# Patient Record
Sex: Male | Born: 1980
Health system: Southern US, Community
[De-identification: ages and names within clinical notes are randomized; demographics above are authoritative.]

## PROBLEM LIST (undated history)

## (undated) DIAGNOSIS — I10 Essential (primary) hypertension: Secondary | ICD-10-CM

## (undated) DIAGNOSIS — B019 Varicella without complication: Secondary | ICD-10-CM

## (undated) DIAGNOSIS — R079 Chest pain, unspecified: Secondary | ICD-10-CM

## (undated) DIAGNOSIS — G4730 Sleep apnea, unspecified: Secondary | ICD-10-CM

## (undated) DIAGNOSIS — K219 Gastro-esophageal reflux disease without esophagitis: Secondary | ICD-10-CM

## (undated) DIAGNOSIS — F419 Anxiety disorder, unspecified: Secondary | ICD-10-CM

## (undated) HISTORY — DX: Varicella without complication: B01.9

## (undated) HISTORY — DX: Anxiety disorder, unspecified: F41.9

## (undated) HISTORY — PX: VASECTOMY: SHX75

## (undated) HISTORY — DX: Sleep apnea, unspecified: G47.30

## (undated) HISTORY — DX: Essential (primary) hypertension: I10

## (undated) HISTORY — DX: Gastro-esophageal reflux disease without esophagitis: K21.9

## (undated) HISTORY — PX: SPINE SURGERY: SHX786

## (undated) HISTORY — DX: Chest pain, unspecified: R07.9

## (undated) HISTORY — PX: CHOLECYSTECTOMY: SHX55

## (undated) HISTORY — PX: APPENDECTOMY: SHX54

---

## 2017-12-08 HISTORY — PX: OTHER SURGICAL HISTORY: SHX169

## 2019-07-27 ENCOUNTER — Other Ambulatory Visit: Payer: Self-pay

## 2019-07-27 ENCOUNTER — Ambulatory Visit (INDEPENDENT_AMBULATORY_CARE_PROVIDER_SITE_OTHER): Payer: Self-pay

## 2019-07-27 ENCOUNTER — Other Ambulatory Visit: Payer: Self-pay | Admitting: Gerontology

## 2019-07-27 DIAGNOSIS — Z0000 Encounter for general adult medical examination without abnormal findings: Secondary | ICD-10-CM

## 2019-12-09 DIAGNOSIS — L03116 Cellulitis of left lower limb: Secondary | ICD-10-CM | POA: Diagnosis not present

## 2019-12-09 DIAGNOSIS — T63301A Toxic effect of unspecified spider venom, accidental (unintentional), initial encounter: Secondary | ICD-10-CM | POA: Diagnosis not present

## 2019-12-09 DIAGNOSIS — Z6834 Body mass index (BMI) 34.0-34.9, adult: Secondary | ICD-10-CM | POA: Diagnosis not present

## 2019-12-13 DIAGNOSIS — S80862A Insect bite (nonvenomous), left lower leg, initial encounter: Secondary | ICD-10-CM | POA: Diagnosis not present

## 2019-12-13 DIAGNOSIS — Z6833 Body mass index (BMI) 33.0-33.9, adult: Secondary | ICD-10-CM | POA: Diagnosis not present

## 2019-12-13 DIAGNOSIS — W57XXXA Bitten or stung by nonvenomous insect and other nonvenomous arthropods, initial encounter: Secondary | ICD-10-CM | POA: Diagnosis not present

## 2019-12-13 DIAGNOSIS — L02416 Cutaneous abscess of left lower limb: Secondary | ICD-10-CM | POA: Diagnosis not present

## 2020-05-08 HISTORY — PX: CHOLECYSTECTOMY, LAPAROSCOPIC: SHX56

## 2020-05-20 ENCOUNTER — Emergency Department
Admission: EM | Admit: 2020-05-20 | Discharge: 2020-05-20 | Disposition: A | Discharge status: Home / Self Care | Payer: Self-pay | Attending: Emergency Medicine | Admitting: Emergency Medicine

## 2020-05-20 ENCOUNTER — Other Ambulatory Visit: Payer: Self-pay

## 2020-05-20 DIAGNOSIS — R112 Nausea with vomiting, unspecified: Secondary | ICD-10-CM | POA: Insufficient documentation

## 2020-05-20 DIAGNOSIS — Z5321 Procedure and treatment not carried out due to patient leaving prior to being seen by health care provider: Secondary | ICD-10-CM | POA: Insufficient documentation

## 2020-05-20 DIAGNOSIS — R1010 Upper abdominal pain, unspecified: Secondary | ICD-10-CM | POA: Insufficient documentation

## 2020-05-20 LAB — URINALYSIS, COMPLETE (UACMP) WITH MICROSCOPIC
Bilirubin Urine: NEGATIVE
Glucose, UA: NEGATIVE mg/dL
Hgb urine dipstick: NEGATIVE
Ketones, ur: NEGATIVE mg/dL
Leukocytes,Ua: NEGATIVE
Nitrite: NEGATIVE
Protein, ur: NEGATIVE mg/dL
Specific Gravity, Urine: 1.021 (ref 1.005–1.030)
Squamous Epithelial / HPF: NONE SEEN (ref 0–5)
pH: 7 (ref 5.0–8.0)

## 2020-05-20 LAB — COMPREHENSIVE METABOLIC PANEL
ALT: 20 U/L (ref 0–44)
AST: 18 U/L (ref 15–41)
Albumin: 4 g/dL (ref 3.5–5.0)
Alkaline Phosphatase: 50 U/L (ref 38–126)
Anion gap: 9 (ref 5–15)
BUN: 11 mg/dL (ref 6–20)
CO2: 26 mmol/L (ref 22–32)
Calcium: 8.7 mg/dL — ABNORMAL LOW (ref 8.9–10.3)
Chloride: 103 mmol/L (ref 98–111)
Creatinine, Ser: 1.02 mg/dL (ref 0.61–1.24)
GFR calc Af Amer: >60 mL/min (ref >60)
GFR calc non Af Amer: >60 mL/min (ref >60)
Glucose, Bld: 118 mg/dL — ABNORMAL HIGH (ref 70–99)
Potassium: 3.7 mmol/L (ref 3.5–5.1)
Sodium: 138 mmol/L (ref 135–145)
Total Bilirubin: 0.7 mg/dL (ref 0.3–1.2)
Total Protein: 6.9 g/dL (ref 6.5–8.1)

## 2020-05-20 LAB — CBC
HCT: 43.2 % (ref 39.0–52.0)
Hemoglobin: 15 g/dL (ref 13.0–17.0)
MCH: 30.4 pg (ref 26.0–34.0)
MCHC: 34.7 g/dL (ref 30.0–36.0)
MCV: 87.6 fL (ref 80.0–100.0)
Platelets: 326 10*3/uL (ref 150–400)
RBC: 4.93 MIL/uL (ref 4.22–5.81)
RDW: 12.6 % (ref 11.5–15.5)
WBC: 12 10*3/uL — ABNORMAL HIGH (ref 4.0–10.5)
nRBC: 0 % (ref 0.0–0.2)

## 2020-05-20 LAB — LIPASE, BLOOD: Lipase: 36 U/L (ref 11–51)

## 2020-05-20 NOTE — ED Notes (Signed)
Patient informed triage tech he was leaving. Patient encouraged to stay to be seen. Patient seen leaving lobby.

## 2020-05-20 NOTE — ED Triage Notes (Signed)
Patient reports upper abdominal pain for the past 5-6 nights.  Reports pain is worse at night.  Patient reports + nausea and vomiting.

## 2020-05-23 ENCOUNTER — Telehealth: Payer: Self-pay | Admitting: General Practice

## 2020-05-23 NOTE — Telephone Encounter (Signed)
Patient submitted new patient appt request  Left message to call back to schedule

## 2020-05-27 DIAGNOSIS — R1011 Right upper quadrant pain: Secondary | ICD-10-CM | POA: Diagnosis not present

## 2020-05-27 DIAGNOSIS — D72829 Elevated white blood cell count, unspecified: Secondary | ICD-10-CM | POA: Diagnosis not present

## 2020-05-27 DIAGNOSIS — R197 Diarrhea, unspecified: Secondary | ICD-10-CM | POA: Diagnosis not present

## 2020-05-27 DIAGNOSIS — K812 Acute cholecystitis with chronic cholecystitis: Secondary | ICD-10-CM | POA: Diagnosis not present

## 2020-05-27 DIAGNOSIS — Z6832 Body mass index (BMI) 32.0-32.9, adult: Secondary | ICD-10-CM | POA: Diagnosis not present

## 2020-05-27 DIAGNOSIS — I10 Essential (primary) hypertension: Secondary | ICD-10-CM | POA: Diagnosis not present

## 2020-05-27 DIAGNOSIS — K8012 Calculus of gallbladder with acute and chronic cholecystitis without obstruction: Secondary | ICD-10-CM | POA: Diagnosis not present

## 2020-05-27 DIAGNOSIS — E669 Obesity, unspecified: Secondary | ICD-10-CM | POA: Diagnosis not present

## 2020-05-27 DIAGNOSIS — Z20822 Contact with and (suspected) exposure to covid-19: Secondary | ICD-10-CM | POA: Diagnosis not present

## 2020-05-27 DIAGNOSIS — K8020 Calculus of gallbladder without cholecystitis without obstruction: Secondary | ICD-10-CM | POA: Diagnosis not present

## 2020-05-27 DIAGNOSIS — K810 Acute cholecystitis: Secondary | ICD-10-CM | POA: Diagnosis not present

## 2020-05-27 DIAGNOSIS — R10821 Right upper quadrant rebound abdominal tenderness: Secondary | ICD-10-CM | POA: Diagnosis not present

## 2020-05-27 DIAGNOSIS — F17200 Nicotine dependence, unspecified, uncomplicated: Secondary | ICD-10-CM | POA: Diagnosis not present

## 2020-05-27 DIAGNOSIS — K828 Other specified diseases of gallbladder: Secondary | ICD-10-CM | POA: Diagnosis not present

## 2020-08-10 ENCOUNTER — Ambulatory Visit: Payer: Self-pay | Admitting: Family Medicine

## 2020-08-22 ENCOUNTER — Other Ambulatory Visit: Payer: Self-pay

## 2020-08-22 ENCOUNTER — Encounter: Payer: Self-pay | Admitting: Family Medicine

## 2020-08-22 ENCOUNTER — Ambulatory Visit (INDEPENDENT_AMBULATORY_CARE_PROVIDER_SITE_OTHER): Payer: BC Managed Care – PPO | Admitting: Family Medicine

## 2020-08-22 VITALS — BP 142/86 | HR 101 | Temp 98.4°F | Ht 70.0 in | Wt 250.0 lb

## 2020-08-22 DIAGNOSIS — Z1159 Encounter for screening for other viral diseases: Secondary | ICD-10-CM

## 2020-08-22 DIAGNOSIS — R635 Abnormal weight gain: Secondary | ICD-10-CM

## 2020-08-22 DIAGNOSIS — Z6835 Body mass index (BMI) 35.0-35.9, adult: Secondary | ICD-10-CM | POA: Diagnosis not present

## 2020-08-22 DIAGNOSIS — R350 Frequency of micturition: Secondary | ICD-10-CM | POA: Diagnosis not present

## 2020-08-22 DIAGNOSIS — Z114 Encounter for screening for human immunodeficiency virus [HIV]: Secondary | ICD-10-CM

## 2020-08-22 DIAGNOSIS — E6609 Other obesity due to excess calories: Secondary | ICD-10-CM

## 2020-08-22 DIAGNOSIS — Z7689 Persons encountering health services in other specified circumstances: Secondary | ICD-10-CM | POA: Diagnosis not present

## 2020-08-22 DIAGNOSIS — K219 Gastro-esophageal reflux disease without esophagitis: Secondary | ICD-10-CM

## 2020-08-22 DIAGNOSIS — R5383 Other fatigue: Secondary | ICD-10-CM | POA: Diagnosis not present

## 2020-08-22 DIAGNOSIS — Z720 Tobacco use: Secondary | ICD-10-CM

## 2020-08-22 LAB — POC URINALSYSI DIPSTICK (AUTOMATED)
Bilirubin, UA: NEGATIVE
Blood, UA: NEGATIVE
Glucose, UA: NEGATIVE
Ketones, UA: NEGATIVE
Leukocytes, UA: NEGATIVE
Nitrite, UA: NEGATIVE
Protein, UA: POSITIVE — AB
Spec Grav, UA: >=1.03 — AB (ref 1.010–1.025)
Urobilinogen, UA: 0.2 E.U./dL
pH, UA: 5.5 (ref 5.0–8.0)

## 2020-08-22 NOTE — Patient Instructions (Signed)
Take over the counter Pepcid (generic is fine) every night with dinner for 2 weeks, can take longer  Consider stopping smoking  Follow up in 3 months for your complete physical  There is not one right eating plan for everyone.  It may take trial and error to find what will work for you.  It is important to get adequate protein and fiber with your meals.  It is okay to not eat breakfast or to skip meals if you are not hungry.  Avoid snacking between meals.  Unless you are on a fluid restriction, drink 80 to 90 ounces of water a day.  Suggested resources- www.dietdoctor.com/diabetes/diet www.adaptyourlifeacademy.com-there is a quiz to help you determine how many carbohydrates you should eat a day  www.thefastingmethod.com  Here are some guidelines to help you with meal planning -  Avoid all processed and packaged foods (bread, pasta, crackers, chips, etc) and beverages containing calories.  Avoid added sugars and excessive natural sugars.  Pay attention to how you feel if you consume artificial sweeteners.  Do they make you more hungry or raise your blood sugar?  With every meal and snack, aim to get 20 g of protein (3 ounces of meat, 4 ounces of fish, 3 eggs, protein powder, 1 cup [place] yogurt, 1 cup cottage cheese, etc.)  Increase fiber in the form of non-starchy vegetables.  These help you feel full with very little carbohydrates and are good for gut health.  Nonstarchy vegetables include summer squash, onions, peppers, tomatoes, eggplant, broccoli, cauliflower, cabbage, lettuce, spinach.  Have small amounts of good fats such as avocado, nuts, olive oil, nut butters, olives.  Add a little cheese to your meals to make them tasty.   Try to plan your meals for the week and do some meal preparation when able.  If possible, make lunches for the week ahead of time.  Plan a couple of dinners and make enough so you can have leftovers.  Build in a treat once a week.    Gastroesophageal Reflux  Disease, Adult Gastroesophageal reflux (GER) happens when acid from the stomach flows up into the tube that connects the mouth and the stomach (esophagus). Normally, food travels down the esophagus and stays in the stomach to be digested. However, when a person has GER, food and stomach acid sometimes move back up into the esophagus. If this becomes a more serious problem, the person may be diagnosed with a disease called gastroesophageal reflux disease (GERD). GERD occurs when the reflux:  Happens often.  Causes frequent or severe symptoms.  Causes problems such as damage to the esophagus. When stomach acid comes in contact with the esophagus, the acid may cause soreness (inflammation) in the esophagus. Over time, GERD may create small holes (ulcers) in the lining of the esophagus. What are the causes? This condition is caused by a problem with the muscle between the esophagus and the stomach (lower esophageal sphincter, or LES). Normally, the LES muscle closes after food passes through the esophagus to the stomach. When the LES is weakened or abnormal, it does not close properly, and that allows food and stomach acid to go back up into the esophagus. The LES can be weakened by certain dietary substances, medicines, and medical conditions, including:  Tobacco use.  Pregnancy.  Having a hiatal hernia.  Alcohol use.  Certain foods and beverages, such as coffee, chocolate, onions, and peppermint. What increases the risk? You are more likely to develop this condition if you:  Have an increased  body weight.  Have a connective tissue disorder.  Use NSAID medicines. What are the signs or symptoms? Symptoms of this condition include:  Heartburn.  Difficult or painful swallowing.  The feeling of having a lump in the throat.  Abitter taste in the mouth.  Bad breath.  Having a large amount of saliva.  Having an upset or bloated stomach.  Belching.  Chest pain. Different  conditions can cause chest pain. Make sure you see your health care provider if you experience chest pain.  Shortness of breath or wheezing.  Ongoing (chronic) cough or a night-time cough.  Wearing away of tooth enamel.  Weight loss. How is this diagnosed? Your health care provider will take a medical history and perform a physical exam. To determine if you have mild or severe GERD, your health care provider may also monitor how you respond to treatment. You may also have tests, including:  A test to examine your stomach and esophagus with a small camera (endoscopy).  A test thatmeasures the acidity level in your esophagus.  A test thatmeasures how much pressure is on your esophagus.  A barium swallow or modified barium swallow test to show the shape, size, and functioning of your esophagus. How is this treated? The goal of treatment is to help relieve your symptoms and to prevent complications. Treatment for this condition may vary depending on how severe your symptoms are. Your health care provider may recommend:  Changes to your diet.  Medicine.  Surgery. Follow these instructions at home: Eating and drinking   Follow a diet as recommended by your health care provider. This may involve avoiding foods and drinks such as: ? Coffee and tea (with or without caffeine). ? Drinks that containalcohol. ? Energy drinks and sports drinks. ? Carbonated drinks or sodas. ? Chocolate and cocoa. ? Peppermint and mint flavorings. ? Garlic and onions. ? Horseradish. ? Spicy and acidic foods, including peppers, chili powder, curry powder, vinegar, hot sauces, and barbecue sauce. ? Citrus fruit juices and citrus fruits, such as oranges, lemons, and limes. ? Tomato-based foods, such as red sauce, chili, salsa, and pizza with red sauce. ? Fried and fatty foods, such as donuts, french fries, potato chips, and high-fat dressings. ? High-fat meats, such as hot dogs and fatty cuts of red and  white meats, such as rib eye steak, sausage, ham, and bacon. ? High-fat dairy items, such as whole milk, butter, and cream cheese.  Eat small, frequent meals instead of large meals.  Avoid drinking large amounts of liquid with your meals.  Avoid eating meals during the 2-3 hours before bedtime.  Avoid lying down right after you eat.  Do not exercise right after you eat. Lifestyle   Do not use any products that contain nicotine or tobacco, such as cigarettes, e-cigarettes, and chewing tobacco. If you need help quitting, ask your health care provider.  Try to reduce your stress by using methods such as yoga or meditation. If you need help reducing stress, ask your health care provider.  If you are overweight, reduce your weight to an amount that is healthy for you. Ask your health care provider for guidance about a safe weight loss goal. General instructions  Pay attention to any changes in your symptoms.  Take over-the-counter and prescription medicines only as told by your health care provider. Do not take aspirin, ibuprofen, or other NSAIDs unless your health care provider told you to do so.  Wear loose-fitting clothing. Do not wear anything tight  around your waist that causes pressure on your abdomen.  Raise (elevate) the head of your bed about 6 inches (15 cm).  Avoid bending over if this makes your symptoms worse.  Keep all follow-up visits as told by your health care provider. This is important. Contact a health care provider if:  You have: ? New symptoms. ? Unexplained weight loss. ? Difficulty swallowing or it hurts to swallow. ? Wheezing or a persistent cough. ? A hoarse voice.  Your symptoms do not improve with treatment. Get help right away if you:  Have pain in your arms, neck, jaw, teeth, or back.  Feel sweaty, dizzy, or light-headed.  Have chest pain or shortness of breath.  Vomit and your vomit looks like blood or coffee grounds.  Faint.  Have  stool that is bloody or black.  Cannot swallow, drink, or eat. Summary  Gastroesophageal reflux happens when acid from the stomach flows up into the esophagus. GERD is a disease in which the reflux happens often, causes frequent or severe symptoms, or causes problems such as damage to the esophagus.  Treatment for this condition may vary depending on how severe your symptoms are. Your health care provider may recommend diet and lifestyle changes, medicine, or surgery.  Contact a health care provider if you have new or worsening symptoms.  Take over-the-counter and prescription medicines only as told by your health care provider. Do not take aspirin, ibuprofen, or other NSAIDs unless your health care provider told you to do so.  Keep all follow-up visits as told by your health care provider. This is important. This information is not intended to replace advice given to you by your health care provider. Make sure you discuss any questions you have with your health care provider. Document Revised: 06/02/2018 Document Reviewed: 06/02/2018 Elsevier Patient Education  2020 [employer].

## 2020-08-22 NOTE — Progress Notes (Signed)
Subjective:    Patient ID: Jerry Banks, male    DOB: 1981/03/11, 39 y.o.   MRN: [id number]  HPI Chief Complaint  Patient presents with   New Patient (Initial Visit)    previous PCP Dr Evert Kohl   Urinary Frequency    x2 weeks denies pain   Hypertension   This is a 39 yo male who presents today to establish care. Accompanied by his wife.   Last CPE- 07/2019- work  Tdap- with in last couple of years following an accident Flu- never gets Covid- not vaccinated, not planning to get vaccinated Dental- regular Eye- unknown Exercise- none  Smoking- 1 ppd, has tried Chantix- bad side effects, quit cold [place], lasted for 9 months, increased stress dealing with his family and he resumed.  Wife also smokes.  Increased urinary frequency x several weeks, increased hunger since gallbladder removed.  Eating large amounts of carbohydrates, sweets, breads.  Prior to gallbladder surgery did not eat large quantities.  Has increased acid indigestion at bedtime.  Has been taking Tums.  Marijuana- nightly for sleep, has been smoking for many years.  Review of Systems Occasional headache, no visual change, no chest pain, shortness of breath, diarrhea, constipation, dysuria, hematuria, leg swelling    Objective:   Physical Exam Physical Exam  Constitutional: Oriented to person, place, and time. Appears well-developed and well-nourished.  HENT:  Head: Normocephalic and atraumatic.  Eyes: Conjunctivae are normal.  Neck: Normal range of motion. Neck supple.  Cardiovascular: Normal rate, regular rhythm and normal heart sounds.   Pulmonary/Chest: Effort normal and breath sounds normal.  Musculoskeletal: No lower extremity edema.   Neurological: Alert and oriented to person, place, and time.  Skin: Skin is warm and dry.  Psychiatric: Normal mood and affect. Behavior is normal. Judgment and thought content normal.  Vitals reviewed.     BP (!) 142/86    Pulse (!) 101    Temp 98.4 F (36.9  C) (Temporal)    Ht 5\' 10"  (1.778 m)    Wt 250 lb (113.4 kg)    SpO2 96%    BMI 35.87 kg/m  Wt Readings from Last 3 Encounters:  08/22/20 250 lb (113.4 kg)  05/20/20 220 lb (99.8 kg)   Depression screen PHQ 2/9 08/22/2020  Decreased Interest 0  Down, Depressed, Hopeless 0  PHQ - 2 Score 0       Assessment & Plan:  1. Encounter to establish care - reviewed available records, health maintenance recommended- flu, Covid vaccine  2. Weight gain - TSH  3. Fatigue, unspecified type - TSH - CBC with Differential - Comprehensive metabolic panel  4. Screening for HIV without presence of risk factors - HIV Antibody (routine testing w rflx)  5. Encounter for hepatitis C screening test for low risk patient - Hepatitis C antibody  6. Class 2 obesity due to excess calories without serious comorbidity with body mass index (BMI) of 35.0 to 35.9 in adult -Provided written and verbal information regarding recommended dietary changes - Lipid Panel  7. Urinary frequency - POCT Urinalysis Dipstick (Automated)  8. Tobacco abuse -Provided written information and encouraged him to consider smoking cessation  9.  Gastroesophageal reflux disease, esophagitis presence unspecified -Instructed him to start taking famotidine daily with dinner, try for 2 weeks, can continue with improvement -Discussed importance of weight loss, provided written and verbal information regarding triggers  This visit occurred during the SARS-CoV-2 public health emergency.  Safety protocols were in place, including screening questions  prior to the visit, additional usage of staff PPE, and extensive cleaning of exam room while observing appropriate contact time as indicated for disinfecting solutions.      Olean Ree, FNP-BC  [facility], [facility]  08/22/2020 5:10 PM

## 2020-08-23 ENCOUNTER — Other Ambulatory Visit (INDEPENDENT_AMBULATORY_CARE_PROVIDER_SITE_OTHER): Payer: BC Managed Care – PPO

## 2020-08-23 DIAGNOSIS — Z131 Encounter for screening for diabetes mellitus: Secondary | ICD-10-CM

## 2020-08-23 LAB — LIPID PANEL
Cholesterol: 120 mg/dL (ref 0–200)
HDL: 35.4 mg/dL — ABNORMAL LOW (ref >39.00)
LDL Cholesterol: 65 mg/dL (ref 0–99)
NonHDL: 84.67
Total CHOL/HDL Ratio: 3
Triglycerides: 99 mg/dL (ref 0.0–149.0)
VLDL: 19.8 mg/dL (ref 0.0–40.0)

## 2020-08-23 LAB — COMPREHENSIVE METABOLIC PANEL
ALT: 19 U/L (ref 0–53)
AST: 18 U/L (ref 0–37)
Albumin: 4.6 g/dL (ref 3.5–5.2)
Alkaline Phosphatase: 49 U/L (ref 39–117)
BUN: 10 mg/dL (ref 6–23)
CO2: 27 mEq/L (ref 19–32)
Calcium: 9.5 mg/dL (ref 8.4–10.5)
Chloride: 104 mEq/L (ref 96–112)
Creatinine, Ser: 1.05 mg/dL (ref 0.40–1.50)
GFR: 79.03 mL/min (ref >60.00)
Glucose, Bld: 94 mg/dL (ref 70–99)
Potassium: 4.1 mEq/L (ref 3.5–5.1)
Sodium: 139 mEq/L (ref 135–145)
Total Bilirubin: 0.5 mg/dL (ref 0.2–1.2)
Total Protein: 7.2 g/dL (ref 6.0–8.3)

## 2020-08-23 LAB — HIV ANTIBODY (ROUTINE TESTING W REFLEX): HIV 1&2 Ab, 4th Generation: NONREACTIVE

## 2020-08-23 LAB — CBC WITH DIFFERENTIAL/PLATELET
Basophils Absolute: 0.1 10*3/uL (ref 0.0–0.1)
Basophils Relative: 1.1 % (ref 0.0–3.0)
Eosinophils Absolute: 0.3 10*3/uL (ref 0.0–0.7)
Eosinophils Relative: 2.3 % (ref 0.0–5.0)
HCT: 46.8 % (ref 39.0–52.0)
Hemoglobin: 15.9 g/dL (ref 13.0–17.0)
Lymphocytes Relative: 40.6 % (ref 12.0–46.0)
Lymphs Abs: 4.8 10*3/uL — ABNORMAL HIGH (ref 0.7–4.0)
MCHC: 34 g/dL (ref 30.0–36.0)
MCV: 90.2 fl (ref 78.0–100.0)
Monocytes Absolute: 0.7 10*3/uL (ref 0.1–1.0)
Monocytes Relative: 6.2 % (ref 3.0–12.0)
Neutro Abs: 5.9 10*3/uL (ref 1.4–7.7)
Neutrophils Relative %: 49.8 % (ref 43.0–77.0)
Platelets: 304 10*3/uL (ref 150.0–400.0)
RBC: 5.19 Mil/uL (ref 4.22–5.81)
RDW: 13.3 % (ref 11.5–15.5)
WBC: 11.7 10*3/uL — ABNORMAL HIGH (ref 4.0–10.5)

## 2020-08-23 LAB — HEPATITIS C ANTIBODY
Hepatitis C Ab: NONREACTIVE
SIGNAL TO CUT-OFF: 0.02 (ref <1.00)

## 2020-08-23 LAB — TSH: TSH: 1.62 u[IU]/mL (ref 0.35–4.50)

## 2020-08-24 LAB — HEMOGLOBIN A1C: Hgb A1c MFr Bld: 5.8 % (ref 4.6–6.5)

## 2020-09-16 DIAGNOSIS — Z20822 Contact with and (suspected) exposure to covid-19: Secondary | ICD-10-CM | POA: Diagnosis not present

## 2020-11-21 ENCOUNTER — Ambulatory Visit: Payer: BC Managed Care – PPO | Admitting: Family Medicine

## 2021-04-05 DIAGNOSIS — Z20822 Contact with and (suspected) exposure to covid-19: Secondary | ICD-10-CM | POA: Diagnosis not present

## 2021-05-09 ENCOUNTER — Other Ambulatory Visit: Payer: Self-pay

## 2021-05-09 ENCOUNTER — Ambulatory Visit: Payer: BC Managed Care – PPO | Admitting: Podiatry

## 2021-05-09 ENCOUNTER — Encounter: Payer: Self-pay | Admitting: Podiatry

## 2021-05-09 DIAGNOSIS — L600 Ingrowing nail: Secondary | ICD-10-CM | POA: Diagnosis not present

## 2021-05-09 NOTE — Patient Instructions (Signed)
Soak Instructions    THE DAY AFTER THE PROCEDURE  Place 1/4 cup of epsom salts or betadinein a quart of warm tap water.  Submerge your foot or feet with outer bandage intact for the initial soak; this will allow the bandage to become moist and wet for easy lift off.  Once you remove your bandage, continue to soak in the solution for 20 minutes.  This soak should be done twice a day.  Next, remove your foot or feet from solution, blot dry the affected area and cover.  You may use a band aid large enough to cover the area or use gauze and tape.  Apply other medications to the area as directed by the doctor such as polysporin or neosporin with pain relief.  IF YOUR SKIN BECOMES IRRITATED WHILE USING THESE INSTRUCTIONS, IT IS OKAY TO SWITCH TO  WHITE VINEGAR AND WATER. Or you may use antibacterial soap and water to keep the toe clean  Monitor for any signs/symptoms of infection. Call the office immediately if any occur or go directly to the emergency room. Call with any questions/concerns.    Long Term Care Instructions-Post Nail Surgery  You have had your ingrown toenail and root treated with a chemical.  This chemical causes a burn that will drain and ooze like a blister.  This can drain for 6-8 weeks or longer.  It is important to keep this area clean, covered, and follow the soaking instructions dispensed at the time of your surgery.  This area will eventually dry and form a scab.  Once the scab forms you no longer need to soak or apply a dressing.  If at any time you experience an increase in pain, redness, swelling, or drainage, you should contact the office as soon as possible. 

## 2021-05-09 NOTE — Progress Notes (Signed)
  Subjective:  Patient ID: Jerry Banks, male    DOB: 1981/09/14,  MRN: [id number]  Chief Complaint  Patient presents with  . Nail Problem    Patient presents today for ingrown toenail right hallux medial border x years off and on.    40 y.o. male presents with the above complaint.  Patient presents with complaint of right hallux medial border ingrown.  Patient states been going on for years on and off he is done self debridement.  He has not seen anyone else prior to seeing me.  He would like to have it removed.  Is just not giving him the relief when doing it himself.  It is not clinically infected.  He denies any other acute complaints.  Pain scale 7 out of 10.   Review of Systems: Negative except as noted in the HPI. Denies N/V/F/Ch.  Past Medical History:  Diagnosis Date  . Chicken pox   . High blood pressure     Current Outpatient Medications:  .  ibuprofen (ADVIL) 600 MG tablet, Take by mouth., Disp: , Rfl:   Social History   Tobacco Use  Smoking Status Current Every Day Smoker  . Packs/day: 1.00  . Types: Cigarettes  Smokeless Tobacco Never Used    No Known Allergies Objective:  There were no vitals filed for this visit. There is no height or weight on file to calculate BMI. Constitutional Well developed. Well nourished.  Vascular Dorsalis pedis pulses palpable bilaterally. Posterior tibial pulses palpable bilaterally. Capillary refill normal to all digits.  No cyanosis or clubbing noted. Pedal hair growth normal.  Neurologic Normal speech. Oriented to person, place, and time. Epicritic sensation to light touch grossly present bilaterally.  Dermatologic Painful ingrowing nail at medial nail borders of the hallux nail right. No other open wounds. No skin lesions.  Orthopedic: Normal joint ROM without pain or crepitus bilaterally. No visible deformities. No bony tenderness.   Radiographs: None Assessment:   1. Ingrown toenail of right foot    Plan:   Patient was evaluated and treated and all questions answered.  Ingrown Nail, right -Patient elects to proceed with minor surgery to remove ingrown toenail removal today. Consent reviewed and signed by patient. -Ingrown nail excised. See procedure note. -Educated on post-procedure care including soaking. Written instructions provided and reviewed. -Patient to follow up in 2 weeks for nail check.  Procedure: Excision of Ingrown Toenail Location: Right 1st toe medial nail borders. Anesthesia: Lidocaine 1% plain; 1.5 mL and Marcaine 0.5% plain; 1.5 mL, digital block. Skin Prep: Betadine. Dressing: Silvadene; telfa; dry, sterile, compression dressing. Technique: Following skin prep, the toe was exsanguinated and a tourniquet was secured at the base of the toe. The affected nail border was freed, split with a nail splitter, and excised. Chemical matrixectomy was then performed with phenol and irrigated out with alcohol. The tourniquet was then removed and sterile dressing applied. Disposition: Patient tolerated procedure well. Patient to return in 2 weeks for follow-up.   No follow-ups on file.

## 2021-05-29 IMAGING — DX CHEST  1 VIEW
1 series · 1 of 1 positions shown · non-contrast
Comparison: None.

CLINICAL DATA: 37-year-old male presenting for annual physical.

EXAM:
CHEST  1 VIEW

[chest pa]
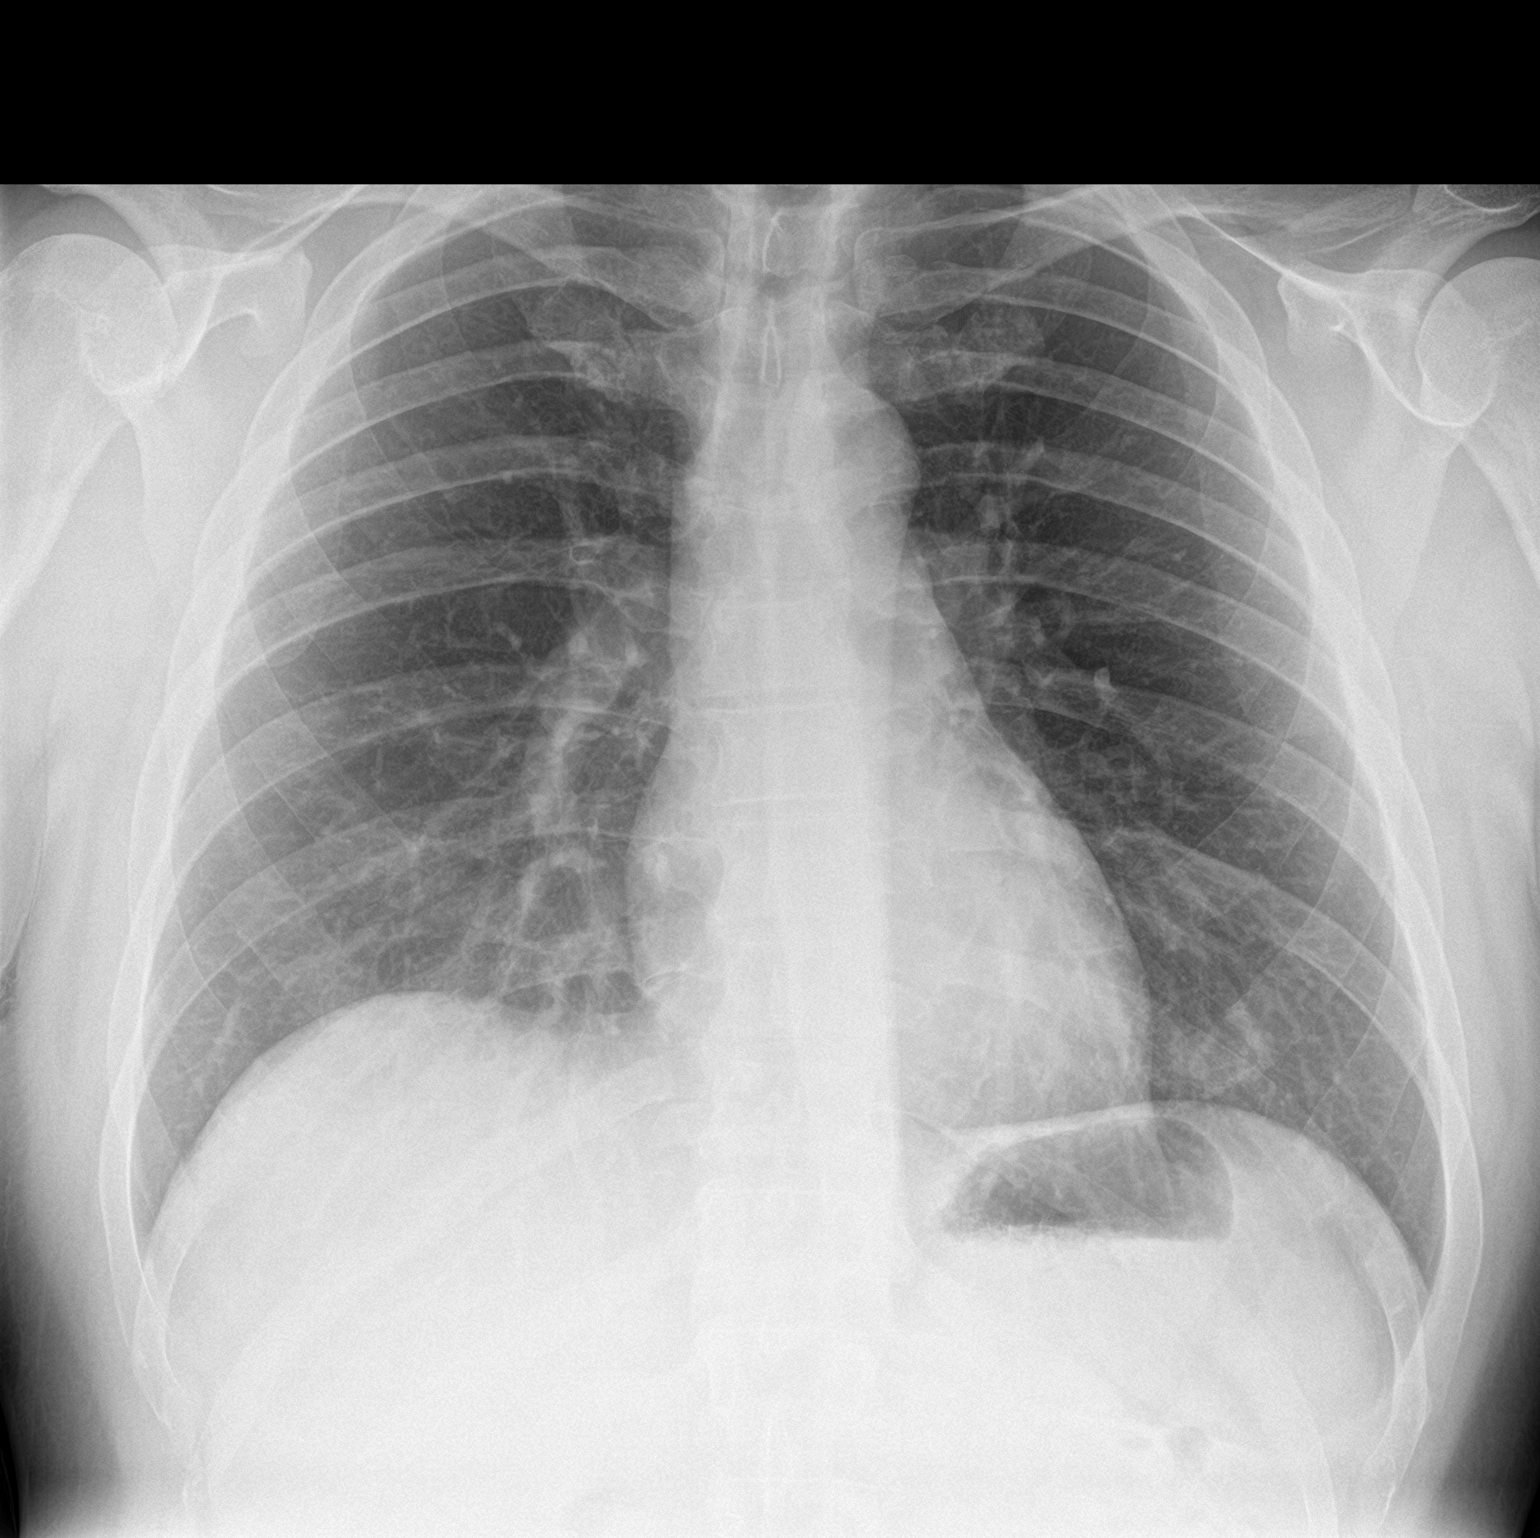

[1 of 1 positions shown; findings below may reference images not displayed]

FINDINGS: The heart size and mediastinal contours are within normal limits.
Both lungs are clear. The visualized skeletal structures are
unremarkable.
IMPRESSION: No active disease.

## 2021-07-04 DIAGNOSIS — Z20822 Contact with and (suspected) exposure to covid-19: Secondary | ICD-10-CM | POA: Diagnosis not present

## 2021-07-29 DIAGNOSIS — R0602 Shortness of breath: Secondary | ICD-10-CM | POA: Diagnosis not present

## 2021-07-29 DIAGNOSIS — R079 Chest pain, unspecified: Secondary | ICD-10-CM | POA: Diagnosis not present

## 2021-07-29 DIAGNOSIS — Z5321 Procedure and treatment not carried out due to patient leaving prior to being seen by health care provider: Secondary | ICD-10-CM | POA: Diagnosis not present

## 2021-12-18 DIAGNOSIS — M542 Cervicalgia: Secondary | ICD-10-CM | POA: Diagnosis not present

## 2021-12-18 DIAGNOSIS — M9901 Segmental and somatic dysfunction of cervical region: Secondary | ICD-10-CM | POA: Diagnosis not present

## 2021-12-18 DIAGNOSIS — M62830 Muscle spasm of back: Secondary | ICD-10-CM | POA: Diagnosis not present

## 2021-12-18 DIAGNOSIS — M4602 Spinal enthesopathy, cervical region: Secondary | ICD-10-CM | POA: Diagnosis not present

## 2022-01-28 DIAGNOSIS — M9901 Segmental and somatic dysfunction of cervical region: Secondary | ICD-10-CM | POA: Diagnosis not present

## 2022-01-28 DIAGNOSIS — M4602 Spinal enthesopathy, cervical region: Secondary | ICD-10-CM | POA: Diagnosis not present

## 2022-01-28 DIAGNOSIS — M542 Cervicalgia: Secondary | ICD-10-CM | POA: Diagnosis not present

## 2022-01-28 DIAGNOSIS — M62830 Muscle spasm of back: Secondary | ICD-10-CM | POA: Diagnosis not present

## 2022-01-30 DIAGNOSIS — M4602 Spinal enthesopathy, cervical region: Secondary | ICD-10-CM | POA: Diagnosis not present

## 2022-01-30 DIAGNOSIS — M542 Cervicalgia: Secondary | ICD-10-CM | POA: Diagnosis not present

## 2022-01-30 DIAGNOSIS — M62830 Muscle spasm of back: Secondary | ICD-10-CM | POA: Diagnosis not present

## 2022-01-30 DIAGNOSIS — M9901 Segmental and somatic dysfunction of cervical region: Secondary | ICD-10-CM | POA: Diagnosis not present

## 2022-02-11 DIAGNOSIS — M62830 Muscle spasm of back: Secondary | ICD-10-CM | POA: Diagnosis not present

## 2022-02-11 DIAGNOSIS — M9901 Segmental and somatic dysfunction of cervical region: Secondary | ICD-10-CM | POA: Diagnosis not present

## 2022-02-11 DIAGNOSIS — M542 Cervicalgia: Secondary | ICD-10-CM | POA: Diagnosis not present

## 2022-02-11 DIAGNOSIS — M4602 Spinal enthesopathy, cervical region: Secondary | ICD-10-CM | POA: Diagnosis not present

## 2022-02-21 ENCOUNTER — Telehealth: Payer: Self-pay

## 2022-02-21 ENCOUNTER — Ambulatory Visit: Payer: BC Managed Care – PPO | Admitting: Family

## 2022-02-21 NOTE — Telephone Encounter (Signed)
Pts wife said that recently pt has H/A on and off and BP 163/100-105.pts wife thinks that pt has pinched nerve issue in neck.now no H/a,CP,SOB, or dizziness. Also pt had GB removed 2020 but on and off having mid abd pain in upper abd and waistline like when had GB attacks. Last time pt had abd pain was 02/20/22.pts wife had made my chart appt for pt and wants to make sure pt does not have problem when comes for appt. I spoke with Red Christians FNP who appt is scheduled with today and she said she will see pt and address most concerning issue today and then pt can do TOC appt.after eval T. Dugal FNP will advise her recommendation for care which could include pt going to ED if necessary. Pts wife voiced understanding and pt will keep appt today. UC & ED precautions given and pts wife voiced understanding. Pt will come approx 30 mins early to get checked in at Tabitha's request. Sending note to T Dugal FNP. ?

## 2022-03-10 ENCOUNTER — Ambulatory Visit: Payer: BC Managed Care – PPO | Admitting: Adult Health

## 2022-04-02 DIAGNOSIS — I10 Essential (primary) hypertension: Secondary | ICD-10-CM | POA: Diagnosis not present

## 2022-04-02 DIAGNOSIS — M542 Cervicalgia: Secondary | ICD-10-CM | POA: Diagnosis not present

## 2022-04-02 DIAGNOSIS — M5412 Radiculopathy, cervical region: Secondary | ICD-10-CM | POA: Diagnosis not present

## 2022-04-02 DIAGNOSIS — F17200 Nicotine dependence, unspecified, uncomplicated: Secondary | ICD-10-CM | POA: Diagnosis not present

## 2022-04-27 DIAGNOSIS — M542 Cervicalgia: Secondary | ICD-10-CM | POA: Diagnosis not present

## 2022-04-27 DIAGNOSIS — R7303 Prediabetes: Secondary | ICD-10-CM | POA: Diagnosis not present

## 2022-04-27 DIAGNOSIS — Z716 Tobacco abuse counseling: Secondary | ICD-10-CM | POA: Diagnosis not present

## 2022-04-27 DIAGNOSIS — I10 Essential (primary) hypertension: Secondary | ICD-10-CM | POA: Diagnosis not present

## 2022-06-08 DIAGNOSIS — M542 Cervicalgia: Secondary | ICD-10-CM | POA: Diagnosis not present

## 2022-06-08 DIAGNOSIS — I10 Essential (primary) hypertension: Secondary | ICD-10-CM | POA: Diagnosis not present

## 2022-08-21 ENCOUNTER — Ambulatory Visit: Payer: BC Managed Care – PPO | Admitting: Podiatry

## 2022-08-21 DIAGNOSIS — L600 Ingrowing nail: Secondary | ICD-10-CM | POA: Diagnosis not present

## 2022-08-28 NOTE — Progress Notes (Signed)
  Subjective:  Patient ID: Jerry Banks, male    DOB: [birth year],  MRN: [id number]  Chief Complaint  Patient presents with   Nail Problem    41 y.o. male presents with the above complaint.  Patient presents with right hallux medial border ingrown.  Patient said pain for touch is progressive gotten worse worse with ambulation hurts with pressure.  He would like to have it removed.  He states that it has grown back again and is causing some pain.  He denies any other acute issues.  7 out of 10 pain scale   Review of Systems: Negative except as noted in the HPI. Denies N/V/F/Ch.  Past Medical History:  Diagnosis Date   Chicken pox    High blood pressure     Current Outpatient Medications:    ibuprofen (ADVIL) 600 MG tablet, Take by mouth., Disp: , Rfl:   Social History   Tobacco Use  Smoking Status Every Day   Packs/day: 1.00   Types: Cigarettes  Smokeless Tobacco Never    No Known Allergies Objective:  There were no vitals filed for this visit. There is no height or weight on file to calculate BMI. Constitutional Well developed. Well nourished.  Vascular Dorsalis pedis pulses palpable bilaterally. Posterior tibial pulses palpable bilaterally. Capillary refill normal to all digits.  No cyanosis or clubbing noted. Pedal hair growth normal.  Neurologic Normal speech. Oriented to person, place, and time. Epicritic sensation to light touch grossly present bilaterally.  Dermatologic Painful ingrowing nail at medial nail borders of the hallux nail right No other open wounds. No skin lesions.  Orthopedic: Normal joint ROM without pain or crepitus bilaterally. No visible deformities. No bony tenderness.   Radiographs: None Assessment:   1. Ingrown toenail of right foot    Plan:  Patient was evaluated and treated and all questions answered.  Ingrown Nail, right -Patient elects to proceed with minor surgery to remove ingrown toenail removal today. Consent reviewed and  signed by patient. -Ingrown nail excised. See procedure note. -Educated on post-procedure care including soaking. Written instructions provided and reviewed. -Patient to follow up in 2 weeks for nail check.  Procedure: Excision of Ingrown Toenail Location: Right 1st toe medial nail borders. Anesthesia: Lidocaine 1% plain; 1.5 mL and Marcaine 0.5% plain; 1.5 mL, digital block. Skin Prep: Betadine. Dressing: Silvadene; telfa; dry, sterile, compression dressing. Technique: Following skin prep, the toe was exsanguinated and a tourniquet was secured at the base of the toe. The affected nail border was freed, split with a nail splitter, and excised. Chemical matrixectomy was then performed with phenol and irrigated out with alcohol. The tourniquet was then removed and sterile dressing applied. Disposition: Patient tolerated procedure well. Patient to return in 2 weeks for follow-up.   No follow-ups on file.

## 2022-10-09 DIAGNOSIS — R509 Fever, unspecified: Secondary | ICD-10-CM | POA: Diagnosis not present

## 2022-10-09 DIAGNOSIS — Z20822 Contact with and (suspected) exposure to covid-19: Secondary | ICD-10-CM | POA: Diagnosis not present

## 2022-10-09 DIAGNOSIS — Z03818 Encounter for observation for suspected exposure to other biological agents ruled out: Secondary | ICD-10-CM | POA: Diagnosis not present

## 2022-10-09 DIAGNOSIS — J069 Acute upper respiratory infection, unspecified: Secondary | ICD-10-CM | POA: Diagnosis not present

## 2023-04-25 DIAGNOSIS — I10 Essential (primary) hypertension: Secondary | ICD-10-CM | POA: Diagnosis not present

## 2023-04-25 DIAGNOSIS — F17200 Nicotine dependence, unspecified, uncomplicated: Secondary | ICD-10-CM | POA: Diagnosis not present

## 2024-01-14 ENCOUNTER — Other Ambulatory Visit: Payer: Self-pay

## 2024-01-14 ENCOUNTER — Emergency Department (HOSPITAL_BASED_OUTPATIENT_CLINIC_OR_DEPARTMENT_OTHER): Payer: BC Managed Care – PPO | Admitting: Radiology

## 2024-01-14 ENCOUNTER — Emergency Department (HOSPITAL_BASED_OUTPATIENT_CLINIC_OR_DEPARTMENT_OTHER)
Admission: EM | Admit: 2024-01-14 | Discharge: 2024-01-14 | Disposition: A | Discharge status: Home / Self Care | Payer: BC Managed Care – PPO | Attending: Emergency Medicine | Admitting: Emergency Medicine

## 2024-01-14 ENCOUNTER — Encounter (HOSPITAL_BASED_OUTPATIENT_CLINIC_OR_DEPARTMENT_OTHER): Payer: Self-pay | Admitting: Emergency Medicine

## 2024-01-14 DIAGNOSIS — I10 Essential (primary) hypertension: Secondary | ICD-10-CM | POA: Diagnosis not present

## 2024-01-14 DIAGNOSIS — R079 Chest pain, unspecified: Secondary | ICD-10-CM | POA: Diagnosis not present

## 2024-01-14 DIAGNOSIS — Z79899 Other long term (current) drug therapy: Secondary | ICD-10-CM | POA: Insufficient documentation

## 2024-01-14 DIAGNOSIS — R61 Generalized hyperhidrosis: Secondary | ICD-10-CM | POA: Diagnosis not present

## 2024-01-14 DIAGNOSIS — E119 Type 2 diabetes mellitus without complications: Secondary | ICD-10-CM | POA: Diagnosis not present

## 2024-01-14 DIAGNOSIS — Z720 Tobacco use: Secondary | ICD-10-CM | POA: Diagnosis not present

## 2024-01-14 DIAGNOSIS — R0789 Other chest pain: Secondary | ICD-10-CM | POA: Insufficient documentation

## 2024-01-14 LAB — BASIC METABOLIC PANEL
Anion gap: 9 (ref 5–15)
BUN: 17 mg/dL (ref 6–20)
CO2: 27 mmol/L (ref 22–32)
Calcium: 9 mg/dL (ref 8.9–10.3)
Chloride: 104 mmol/L (ref 98–111)
Creatinine, Ser: 1.1 mg/dL (ref 0.61–1.24)
GFR, Estimated: >60 mL/min (ref >60)
Glucose, Bld: 96 mg/dL (ref 70–99)
Potassium: 3.8 mmol/L (ref 3.5–5.1)
Sodium: 140 mmol/L (ref 135–145)

## 2024-01-14 LAB — TROPONIN I (HIGH SENSITIVITY)
Troponin I (High Sensitivity): 2 ng/L (ref <18)
Troponin I (High Sensitivity): 3 ng/L (ref <18)

## 2024-01-14 LAB — CBC
HCT: 48.4 % (ref 39.0–52.0)
Hemoglobin: 16.7 g/dL (ref 13.0–17.0)
MCH: 30.5 pg (ref 26.0–34.0)
MCHC: 34.5 g/dL (ref 30.0–36.0)
MCV: 88.5 fL (ref 80.0–100.0)
Platelets: 297 10*3/uL (ref 150–400)
RBC: 5.47 MIL/uL (ref 4.22–5.81)
RDW: 12.3 % (ref 11.5–15.5)
WBC: 9.8 10*3/uL (ref 4.0–10.5)
nRBC: 0 % (ref 0.0–0.2)

## 2024-01-14 NOTE — ED Provider Notes (Signed)
 [place] EMERGENCY DEPARTMENT AT [facility] Provider Note   CSN: [id number] Arrival date & time: 01/14/24  1142     History  Chief Complaint  Patient presents with   Chest Pain    Jerry Banks is a 43 y.o. male past medical history of high blood pressure, 1 pack/day tobacco abuse, with no history of hyperlipidemia, diabetes who presents with concern for left-sided chest pain that began while he was working today.  Patient is a heavy arboriculturist.  He endorsed blood pressure was high with systolic around 190, he felt sweaty.  He endorsed some head fog.  He denied any nausea, vomiting, shortness of breath.  He reports 1 previous episode of this around 2-1/2 years ago with no return of symptoms since then.  He does not think he had a stress test or an echo at that time.   Chest Pain      Home Medications Prior to Admission medications   Medication Sig Start Date End Date Taking? Authorizing Provider  ibuprofen (ADVIL) 600 MG tablet Take by mouth.    [provider]  losartan -hydrochlorothiazide (HYZAAR) 100-25 MG tablet Take 1 tablet by mouth daily.    [provider]      Allergies    Patient has no known allergies.    Review of Systems   Review of Systems  Cardiovascular:  Positive for chest pain.  All other systems reviewed and are negative.   Physical Exam Updated Vital Signs BP (!) 150/110   Pulse 70   Temp 98.3 F (36.8 C)   Resp (!) 21   SpO2 96%  Physical Exam Vitals and nursing note reviewed.  Constitutional:      General: He is not in acute distress.    Appearance: Normal appearance.  HENT:     Head: Normocephalic and atraumatic.  Eyes:     General:        Right eye: No discharge.        Left eye: No discharge.  Cardiovascular:     Rate and Rhythm: Normal rate and regular rhythm.     Heart sounds: No murmur heard.    No friction rub. No gallop.  Pulmonary:     Effort: Pulmonary effort is normal.     Breath  sounds: Normal breath sounds.  Chest:     Comments: No ttp of chest wall Abdominal:     General: Bowel sounds are normal.     Palpations: Abdomen is soft.  Skin:    General: Skin is warm and dry.     Capillary Refill: Capillary refill takes less than 2 seconds.  Neurological:     Mental Status: He is alert and oriented to person, place, and time.  Psychiatric:        Mood and Affect: Mood normal.        Behavior: Behavior normal.     ED Results / Procedures / Treatments   Labs (all labs ordered are listed, but only abnormal results are displayed) Labs Reviewed  BASIC METABOLIC PANEL  CBC  TROPONIN I (HIGH SENSITIVITY)  TROPONIN I (HIGH SENSITIVITY)    EKG EKG Interpretation Date/Time:  Thursday January 14 2024 11:52:13 EST Ventricular Rate:  72 PR Interval:  144 QRS Duration:  100 QT Interval:  374 QTC Calculation: 409 R Axis:   147  Text Interpretation: Normal sinus rhythm Left posterior fascicular block Abnormal ECG No previous ECGs available Confirmed by Zackowski, Scott [phone]) on 01/14/2024 3:01:35 PM  Radiology  DG Chest 2 View Result Date: 01/14/2024 CLINICAL DATA:  Chest pain, hypertension, and diaphoresis. EXAM: CHEST - 2 VIEW COMPARISON:  07/27/2019 FINDINGS: The heart size and mediastinal contours are within normal limits. Both lungs are clear. The visualized skeletal structures are unremarkable. IMPRESSION: No active cardiopulmonary disease. Electronically Signed   By: Norleen DELENA Kil M.D.   On: 01/14/2024 12:33    Procedures Procedures    Medications Ordered in ED Medications - No data to display  ED Course/ Medical Decision Making/ A&P             HEART Score: 2                    Medical Decision Making Amount and/or Complexity of Data Reviewed Labs: ordered. Radiology: ordered.   This patient is a 43 y.o. male  who presents to the ED for concern of chest pain.   Differential diagnoses prior to evaluation: The emergent differential diagnosis  includes, but is not limited to,  ACS, AAS, PE, Mallory-Weiss, Boerhaave's, Pneumonia, acute bronchitis, asthma or COPD exacerbation, anxiety, MSK pain or traumatic injury to the chest, acid reflux versus other . This is not an exhaustive differential.   Past Medical History / Co-morbidities / Social History: HTN, tobacco use  Additional history: Chart reviewed. Pertinent results include: no previous stress test or echo  Physical Exam: Physical exam performed. The pertinent findings include: no ttp of chest wall  Lab Tests/Imaging studies: I personally interpreted labs/imaging and the pertinent results include: CBC unremarkable, BMP unremarkable, troponin initially 2, with delta at 3.  No longer having active chest pain. I independently interpreted plain film radiograph of the chest which shows no evidence of acute intrathoracic abnormality. I agree with the radiologist interpretation.  Cardiac monitoring: EKG obtained and interpreted by myself and attending physician which shows: NSR, no acute ST-T changes at this time   Treatment: Heart score: 2, chest pain free on re-eval, but with hx of htn, tobacco abuse. I think amb referral to cardiology is warranted for stress test and echo. Patient understands and agrees to this plan at this time.  Disposition: After consideration of the diagnostic results and the patients response to treatment, I feel that patient is stable for discharge with plan for chest pain evaluation outpatient, extensive return precautions given. .   emergency department workup does not suggest an emergent condition requiring admission or immediate intervention beyond what has been performed at this time. The plan is: as above. The patient is safe for discharge and has been instructed to return immediately for worsening symptoms, change in symptoms or any other concerns.  Final Clinical Impression(s) / ED Diagnoses Final diagnoses:  Chest pain, unspecified type    Rx /  DC Orders ED Discharge Orders          Ordered    Ambulatory referral to Cardiology        01/14/24 1527              Rosan Sherlean VEAR DEVONNA 01/14/24 1529    Geraldene Hamilton, MD 01/15/24 [phone]

## 2024-01-14 NOTE — Discharge Instructions (Addendum)
 Your workup was reassuring, but I do think following up with cardiology is warranted given your risk factors and description of symptoms. You should receive a call from them within 72 hours to schedule a follow up. Please return if you have return or worsening of chest pain, new shortness of breath with exertion, nausea, vomiting.

## 2024-01-14 NOTE — ED Triage Notes (Signed)
 Chest pain to back high BP Sweating.  Head fog  Started this morning

## 2024-01-26 ENCOUNTER — Telehealth (HOSPITAL_BASED_OUTPATIENT_CLINIC_OR_DEPARTMENT_OTHER): Payer: Self-pay | Admitting: Family Medicine

## 2024-01-26 NOTE — Telephone Encounter (Signed)
error 

## 2024-01-27 ENCOUNTER — Telehealth: Payer: Self-pay | Admitting: *Deleted

## 2024-01-27 ENCOUNTER — Ambulatory Visit (HOSPITAL_BASED_OUTPATIENT_CLINIC_OR_DEPARTMENT_OTHER): Payer: BC Managed Care – PPO | Admitting: Family Medicine

## 2024-01-27 NOTE — Telephone Encounter (Signed)
Left VM requesting pt to call the office back, Dr. Milinda Antis is only doing virtual visit tomorrow so pt either needs to change appt to virtual or r/s to a different date (any opening is okay)

## 2024-01-28 ENCOUNTER — Encounter: Payer: Self-pay | Admitting: Family Medicine

## 2024-01-28 ENCOUNTER — Other Ambulatory Visit: Payer: Self-pay | Admitting: Family Medicine

## 2024-01-28 ENCOUNTER — Telehealth: Payer: BC Managed Care – PPO | Admitting: Family Medicine

## 2024-01-28 VITALS — BP 150/90 | HR 97 | Temp 97.6°F | Ht 70.0 in | Wt 262.0 lb

## 2024-01-28 DIAGNOSIS — F17200 Nicotine dependence, unspecified, uncomplicated: Secondary | ICD-10-CM | POA: Diagnosis not present

## 2024-01-28 DIAGNOSIS — I10 Essential (primary) hypertension: Secondary | ICD-10-CM

## 2024-01-28 DIAGNOSIS — Z6837 Body mass index (BMI) 37.0-37.9, adult: Secondary | ICD-10-CM

## 2024-01-28 DIAGNOSIS — E66812 Obesity, class 2: Secondary | ICD-10-CM

## 2024-01-28 DIAGNOSIS — M545 Low back pain, unspecified: Secondary | ICD-10-CM | POA: Diagnosis not present

## 2024-01-28 DIAGNOSIS — R0789 Other chest pain: Secondary | ICD-10-CM

## 2024-01-28 DIAGNOSIS — G8929 Other chronic pain: Secondary | ICD-10-CM | POA: Diagnosis not present

## 2024-01-28 DIAGNOSIS — E6609 Other obesity due to excess calories: Secondary | ICD-10-CM | POA: Insufficient documentation

## 2024-01-28 DIAGNOSIS — Z131 Encounter for screening for diabetes mellitus: Secondary | ICD-10-CM | POA: Insufficient documentation

## 2024-01-28 DIAGNOSIS — F418 Other specified anxiety disorders: Secondary | ICD-10-CM | POA: Insufficient documentation

## 2024-01-28 MED ORDER — BUSPIRONE HCL 15 MG PO TABS: 7.5000 mg | ORAL_TABLET | Freq: Two times a day (BID) | ORAL | 1 refills | Status: DC

## 2024-01-28 MED ORDER — AMLODIPINE BESYLATE 5 MG PO TABS: 5.0000 mg | ORAL_TABLET | Freq: Every day | ORAL | 1 refills | Status: DC

## 2024-01-28 NOTE — Telephone Encounter (Signed)
See mychart message. Please advise.

## 2024-01-28 NOTE — Assessment & Plan Note (Signed)
Reviewed ER visit/notes/labs and studies from 2/6 Overall reassuring  Is smoker with out of control HTN    Referral made to cardiology for eval and treatment   Call back and Er precautions noted in detail today

## 2024-01-28 NOTE — Assessment & Plan Note (Signed)
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

## 2024-01-28 NOTE — Assessment & Plan Note (Signed)
In setting of obesity will check A1c at next visit in 2-3 weeks Working on better diet

## 2024-01-28 NOTE — Assessment & Plan Note (Addendum)
bp is not in control currently BP Readings from Last 1 Encounters:  01/28/24 (!) 150/90   Plan to continue losartan hct 100-25 mg daily  Add amlodipine 5 mg daily (call if side effects or problems)  Follow up 2-3 wk  Most recent labs reviewed  Disc lifstyle change with low sodium diet and exercise (has recently done better with this) Also cardiology referral done for recent atypical chest discomfort

## 2024-01-28 NOTE — Assessment & Plan Note (Signed)
Lumbar fusion in the past  Takes naproxen prn (aware this may increase blood pressure) Also cyclobenzaprine prn -at night /does not use daily

## 2024-01-28 NOTE — Progress Notes (Signed)
Virtual Visit via Video Note  I connected with Jerry Banks on 01/28/24 at 11:00 AM EST by a video enabled telemedicine application and verified that I am speaking with the correct person using two identifiers.  Patient Location: Other:  office  Provider Location: Home Office  I discussed the limitations, risks, security, and privacy concerns of performing an evaluation and management service by video and the availability of in person appointments. I also discussed with the patient that there may be a patient responsible charge related to this service. The patient expressed understanding and agreed to proceed.  Parties involved in encounter  Patient: Jerry Banks   Provider:  Roxy Manns MD   Subjective: PCP: Judy Pimple, MD  Chief Complaint  Patient presents with   Transitions Of Care  Smoking Anxiety  Chest discomfort  HTN Stress reactoin   HPI 43 yo pt presents to get established for primary care  Is a current smoker  Works as a heavy Nurse, children's Readings from Last 3 Encounters:  01/28/24 262 lb (118.8 kg)  08/22/20 250 lb (113.4 kg)  05/20/20 220 lb (99.8 kg)    37.59 kg/m   Vitals:   01/28/24 1102  BP: (!) 150/90  Pulse: 97  Temp: 97.6 F (36.4 C)  SpO2: 98%     Saw NP Gessner here in 2021 -noted GERD at that time  Unsure who has been his pcp / was going to [place]?    Hypertension  bp is stable today  No cp or palpitations or headaches or edema  No side effects to medicines  BP Readings from Last 3 Encounters:  01/28/24 (!) 150/90  01/14/24 (!) 150/110  08/22/20 (!) 142/86    Losartan hydrochlorothiazide 100-25 mg daily  Does not check blood pressure at home but has not checked it lately  Has been getting pain in chest   Has HTN in the family  MGM had several strokes   He has naproxen on med list  Takes if for back pain (lumbar fusion in the past 2018)  Also generic flexeril when he needs it     Is a good eater  overall - making effort to eat better  Job is somewhat physical  Jimmy[employer] at home -lot of exercise  Big garden  Animals      Mental health Wellbutrin 75 mg daily is on med list      01/28/2024   11:08 AM 08/22/2020    3:52 PM  Depression screen PHQ 2/9  Decreased Interest 3 0  Down, Depressed, Hopeless 3 0  PHQ - 2 Score 6 0  Altered sleeping 0   Tired, decreased energy 2   Change in appetite 0   Feeling bad or failure about yourself  0   Trouble concentrating 0   Moving slowly or fidgety/restless 0   Suicidal thoughts 0   PHQ-9 Score 8   Difficult doing work/chores Somewhat difficult       01/28/2024   11:09 AM  GAD 7 : Generalized Anxiety Score  Nervous, Anxious, on Edge 3  Control/stop worrying 3  Worry too much - different things 3  Trouble relaxing 2  Restless 3  Easily annoyed or irritable 3  Afraid - awful might happen 2  Total GAD 7 Score 19  Anxiety Difficulty Somewhat difficult   Currently taking bupropion 75 mg daily  Was taking to help quit smoking but really helped his mood   Has not been labeled with  MH dx  More anxious lately  Overwhelmed by amount to do  ? How long it has been going on   Has never done talk therapy    Enjoys farming    Wife has breast cancer - double mastectomy planned    Has 27 1/42 year old at home  Works long hours       Was seen in ED on 2/6 for chest pain  EKG noted NSR with left posterior fascicular block  Neg troponin  Cxr showed no active cardiopulm dz  From that note: Heart score: 2, chest pain free on re-eval, but with hx of htn, tobacco abuse. I think amb referral to cardiology is warranted for stress test and echo. Patient understands and agrees to this plan at this time.   Lab Results  Component Value Date   NA 140 01/14/2024   K 3.8 01/14/2024   CO2 27 01/14/2024   GLUCOSE 96 01/14/2024   BUN 17 01/14/2024   CREATININE 1.10 01/14/2024   CALCIUM 9.0 01/14/2024   GFR 79.03 08/22/2020   GFRNONAA  >60 01/14/2024   Lab Results  Component Value Date   WBC 9.8 01/14/2024   HGB 16.7 01/14/2024   HCT 48.4 01/14/2024   MCV 88.5 01/14/2024   PLT 297 01/14/2024   Lab Results  Component Value Date   HGBA1C 5.8 08/23/2020   Lab Results  Component Value Date   TSH 1.62 08/22/2020   Lab Results  Component Value Date   CHOL 120 08/22/2020   HDL 35.40 (L) 08/22/2020   LDLCALC 65 08/22/2020   TRIG 99.0 08/22/2020   CHOLHDL 3 08/22/2020    Per chart saw cardiology in [place]   ROS: Per HPI Review of Systems  Constitutional:  Positive for malaise/fatigue. Negative for chills and fever.  HENT:  Negative for congestion, ear pain, sinus pain and sore throat.   Eyes:  Negative for blurred vision, discharge and redness.  Respiratory:  Negative for cough, shortness of breath, wheezing and stridor.   Cardiovascular:  Positive for chest pain. Negative for palpitations, orthopnea, claudication and leg swelling.  Gastrointestinal:  Negative for abdominal pain, diarrhea, nausea and vomiting.  Musculoskeletal:  Negative for myalgias.  Skin:  Negative for rash.  Neurological:  Negative for dizziness and headaches.  Psychiatric/Behavioral:  Positive for depression. The patient is nervous/anxious.      Current Outpatient Medications:    amLODipine (NORVASC) 5 MG tablet, Take 1 tablet (5 mg total) by mouth daily., Disp: 90 tablet, Rfl: 1   buPROPion (WELLBUTRIN) 75 MG tablet, Take 75 mg by mouth daily., Disp: , Rfl:    busPIRone (BUSPAR) 15 MG tablet, Take 0.5 tablets (7.5 mg total) by mouth 2 (two) times daily., Disp: 30 tablet, Rfl: 1   cyclobenzaprine (FLEXERIL) 10 MG tablet, Take 10 mg by mouth., Disp: , Rfl:    losartan-hydrochlorothiazide (HYZAAR) 100-25 MG tablet, Take 1 tablet by mouth daily., Disp: , Rfl:    naproxen sodium (ANAPROX) 550 MG tablet, , Disp: , Rfl:   Observations/Objective: Today's Vitals   01/28/24 1102  BP: (!) 150/90  Pulse: 97  Temp: 97.6 F (36.4 C)  TempSrc:  Oral  SpO2: 98%  Weight: 262 lb (118.8 kg)  Height: 5\' 10"  (1.778 m)    Physical Exam Patient appears well, in no distress Weight is baseline/obese  No facial swelling or asymmetry Normal voice-not hoarse and no slurred speech No obvious tremor or mobility impairment Moving neck and UEs normally Able to hear  the call well  No cough or shortness of breath during interview  Talkative and mentally sharp with no cognitive changes No skin changes on face or neck , no rash or pallor Affect is normal today - Candidly discusses symptoms and stressors    Assessment and Plan: Primary hypertension Assessment & Plan: bp is not in control currently BP Readings from Last 1 Encounters:  01/28/24 (!) 150/90   Plan to continue losartan hct 100-25 mg daily  Add amlodipine 5 mg daily (call if side effects or problems)  Follow up 2-3 wk  Most recent labs reviewed  Disc lifstyle change with low sodium diet and exercise (has recently done better with this) Also cardiology referral done for recent atypical chest discomfort   Orders: -     Ambulatory referral to Cardiology  Smoker Assessment & Plan: About 1ppd  Would like to quit  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  Takes wellbutrin (helps depression)   Has HTN also   Will discuss strategy to quit more next time Very high stress level currently    Chronic low back pain, unspecified back pain laterality, unspecified whether sciatica present Assessment & Plan: Lumbar fusion in the past  Takes naproxen prn (aware this may increase blood pressure) Also cyclobenzaprine prn -at night /does not use daily      Depression with anxiety Assessment & Plan: Both depressed and anxious mood recently in the setting of stress   Wellbutrin is helping the depression  Not open to counseling currently in light of schedule (encouraged to consider this later) PHQ 8 GAD7 19   No SI  Will try buspar for anxious mood  7.5 mg  bid Discussed expectations of this medication including time to effectiveness and mechanism of action, also poss of side effects (early and late)- including mental fuzziness, weight or appetite change, nausea and poss of worse dep or anxiety (even suicidal thoughts)  Pt voiced understanding and will stop med and update if this occurs    Encouraged self care Plan follow up 2-3 wk or earlier if needed     Chest discomfort Assessment & Plan: Reviewed ER visit/notes/labs and studies from 2/6 Overall reassuring  Is smoker with out of control HTN    Referral made to cardiology for eval and treatment   Call back and Er precautions noted in detail today    Orders: -     Ambulatory referral to Cardiology  Class 2 severe obesity due to excess calories with serious comorbidity and body mass index (BMI) of 37.0 to 37.9 in adult [facility]) Assessment & Plan: Pt is starting to work on lifestyle habits  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  Dispense: 90 tablet; Refill: 1 -     busPIRone HCl; Take 0.5 tablets (7.5 mg total) by mouth 2 (two) times daily.  Dispense: 30 tablet; Refill: 1    Follow Up Instructions: I put the referral in for cardiology  Please let us know if you don't hear in 1-2 weeks  If symptoms worsen/become severe, go back to the ER   Continue current medicines Take naproxen only when absolutely needed since it can increase blood pressure   Add amlodipine 5 mg daily for blood pressure (am or pm)   Try to avoid processed foods when you can Try to get most of your carbohydrates  from produce (with the exception of white potatoes) and whole grains Eat less bread/pasta/rice/snack foods/cereals/sweets and other items from the middle of the grocery store (processed carbs)  Start buspar for anxious mood 1/2 pill bid  If any intolerable side effects or if you feel worse (more anxous or depressed) stop it and let us know   Follow up in 2-3 weeks    I discussed the assessment and treatment plan with the patient. The patient was provided an opportunity to ask questions, and all were answered. The patient agreed with the plan and demonstrated an understanding of the instructions.   The patient was advised to call back or seek an in-person evaluation if the symptoms worsen or if the condition fails to improve as anticipated.  The above assessment and management plan was discussed with the patient. The patient verbalized understanding of and has agreed to the management plan.   Roxy Manns, MD

## 2024-01-28 NOTE — Assessment & Plan Note (Signed)
Both depressed and anxious mood recently in the setting of stress   Wellbutrin is helping the depression  Not open to counseling currently in light of schedule (encouraged to consider this later) PHQ 8 GAD7 19   No SI  Will try buspar for anxious mood  7.5 mg bid Discussed expectations of this medication including time to effectiveness and mechanism of action, also poss of side effects (early and late)- including mental fuzziness, weight or appetite change, nausea and poss of worse dep or anxiety (even suicidal thoughts)  Pt voiced understanding and will stop med and update if this occurs    Encouraged self care Plan follow up 2-3 wk or earlier if needed

## 2024-01-28 NOTE — Assessment & Plan Note (Signed)
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

## 2024-01-28 NOTE — Patient Instructions (Addendum)
I put the referral in for cardiology  Please let us know if you don't hear in 1-2 weeks   If symptoms worsen/become severe, go back to the ER   Continue current medicines Take naproxen only when absolutely needed since it can increase blood pressure   Add amlodipine 5 mg daily for blood pressure (am or pm)   Try to avoid processed foods when you can Try to get most of your carbohydrates from produce (with the exception of white potatoes) and whole grains Eat less bread/pasta/rice/snack foods/cereals/sweets and other items from the middle of the grocery store (processed carbs)  Start buspar for anxious mood 1/2 pill bid  If any intolerable side effects or if you feel worse (more anxous or depressed) stop it and let us know   Follow up in 2-3 weeks

## 2024-02-08 ENCOUNTER — Other Ambulatory Visit: Payer: Self-pay | Admitting: Family Medicine

## 2024-02-08 DIAGNOSIS — I10 Essential (primary) hypertension: Secondary | ICD-10-CM

## 2024-02-09 ENCOUNTER — Other Ambulatory Visit: Payer: Self-pay | Admitting: Family Medicine

## 2024-02-09 MED ORDER — LOSARTAN POTASSIUM-HCTZ 100-25 MG PO TABS: 1.0000 | ORAL_TABLET | Freq: Every day | ORAL | 3 refills | Status: AC

## 2024-02-19 ENCOUNTER — Ambulatory Visit (HOSPITAL_BASED_OUTPATIENT_CLINIC_OR_DEPARTMENT_OTHER): Payer: BC Managed Care – PPO | Admitting: Family Medicine

## 2024-03-15 ENCOUNTER — Ambulatory Visit (HOSPITAL_BASED_OUTPATIENT_CLINIC_OR_DEPARTMENT_OTHER): Payer: BC Managed Care – PPO | Admitting: Family Medicine

## 2024-04-12 ENCOUNTER — Ambulatory Visit: Payer: BC Managed Care – PPO | Admitting: Cardiology

## 2024-04-14 ENCOUNTER — Ambulatory Visit: Payer: BC Managed Care – PPO | Attending: Internal Medicine | Admitting: Internal Medicine

## 2024-04-14 ENCOUNTER — Encounter: Payer: Self-pay | Admitting: Internal Medicine

## 2024-04-14 VITALS — BP 123/84 | HR 89 | Ht 71.0 in | Wt 260.0 lb

## 2024-04-14 DIAGNOSIS — R072 Precordial pain: Secondary | ICD-10-CM

## 2024-04-14 DIAGNOSIS — R0789 Other chest pain: Secondary | ICD-10-CM | POA: Diagnosis not present

## 2024-04-14 DIAGNOSIS — Z01812 Encounter for preprocedural laboratory examination: Secondary | ICD-10-CM

## 2024-04-14 DIAGNOSIS — F17200 Nicotine dependence, unspecified, uncomplicated: Secondary | ICD-10-CM

## 2024-04-14 DIAGNOSIS — I10 Essential (primary) hypertension: Secondary | ICD-10-CM | POA: Diagnosis not present

## 2024-04-14 MED ORDER — METOPROLOL TARTRATE 100 MG PO TABS: 100.0000 mg | ORAL_TABLET | Freq: Once | ORAL | 0 refills | Status: AC

## 2024-04-14 MED ORDER — VARENICLINE TARTRATE 1 MG PO TABS: ORAL_TABLET | ORAL | 0 refills | Status: AC

## 2024-04-14 NOTE — Progress Notes (Signed)
 Cardiology Office Note:  .    Date:  04/14/2024  ID:  Jerry Banks, DOB 12/02/1981, MRN [id number] PCP: Jerry Curt, MD  [facility] Providers Cardiologist:  None     CC: Told to come Consulted for the evaluation of chest pain at the behest of [name] PA-C  History of Present Illness: .    Jerry Banks is a 43 y.o. male with chest pain. He was evaluated by Jerry Banks [facility] and referred for follow-up.  He presents for follow-up after an episode of chest pain associated with hypertensive urgency. Initially, he had a blood pressure of 190 mmHg and chest pain. An EKG showed normal sinus rhythm with no ischemic changes, and troponin levels were normal. He was discharged after blood pressure stabilization.  He describes a history of chest pain that was initially frequent, occurring weekly, and associated with stress, dizziness, and a 'funny headed' feeling. The pain was severe and located in the center of his chest. He has not experienced any chest pain recently.  He has a significant history of tobacco use, smoking about a pack a day since age 89, totaling a 30 pack-year history. He has not attempted to quit smoking recently, although he previously used Chantix successfully for two weeks, which helped him quit for eight months.  No family history of heart problems, despite his family members also being smokers. He has experienced episodes of high stress, which he believes contribute to his symptoms. He describes himself as having a 'short fuse' in the past but has since calmed down, although he still experiences internal stress.  He mentions a history of slightly elevated cholesterol levels, for which he has been trying to eat healthily, but he is unsure of his current cholesterol status. No current chest pain, dizziness, or other symptoms since the initial episode.  Discussed the use of AI scribe software for clinical note transcription with the patient, who gave verbal consent  to proceed.   Relevant histories: .  Social  - Tobacco abuse, currently smoking about a pack a day (started with about a pack a day when the patient was 43 years old) - Works in Engineer, manufacturing systems - Has a 43 year old ROS: As per HPI.    Physical Exam:    VS:  BP 123/84 (BP Location: Right Arm)   Pulse 89   Ht 5\' 11"  (1.803 m)   Wt 260 lb (117.9 kg)   SpO2 94%   BMI 36.26 kg/m    Wt Readings from Last 3 Encounters:  04/14/24 260 lb (117.9 kg)  01/28/24 262 lb (118.8 kg)  08/22/20 250 lb (113.4 kg)    Gen: no distress, morbid obesity, smells of smoke   Neck: No JVD Cardiac: No Rubs or Gallops, no murmur, RRR +2 radial pulses Respiratory: Clear to auscultation bilaterally, normal effort, normal  respiratory rate GI: Soft, nontender, non-distended  MS: No  edema;  moves all extremities Integument: Skin feels warm Neuro:  At time of evaluation, alert and oriented to person/place/time/situation  Psych: Normal affect, patient feels ok   ASSESSMENT AND PLAN: .    Precordial chest pain Intermittent precordial chest pain with a history of hypertensive urgency and tobacco use. No recent episodes. Differential diagnosis includes hypertensive emergency and stress-induced chest pain. Risk factors include a 27 pack-year smoking history. No family history of cardiovascular disease. Previous episodes associated with stress and elevated blood pressure. Normal sinus rhythm and normal troponins on previous evaluation. No ischemic symptoms on  EKG. CT scan of the heart planned to rule out coronary artery disease. Discussed potential CT findings: no blockages, mild blockages requiring prevention, or significant blockages necessitating intervention. - Order CT scan of the heart to assess for coronary artery disease - If CT scan shows no blockages, advise smoking cessation and monitor for symptoms - If CT scan shows mild blockages, initiate aggressive prevention measures and follow up for further  management - If CT scan shows significant blockages, discuss further intervention options  Hypertensive urgency - presently with HTN and morbid obesity Hypertensive urgency with a systolic blood pressure of 190 mmHg, now normalized. Episodes associated with stress and anxiety. Currently on anxiety medication contributing to blood pressure control. - continue current therapy  Tobacco use disorder Tobacco use disorder with a 27 pack-year history. Previous successful smoking cessation with Chantix for 8 months. No suicidal ideation reported with Chantix use. He is willing to retry Chantix. Discussed potential adverse effects and advised discontinuation if experiencing increased anxiety or suicidal ideation. - Prescribe Chantix for smoking cessation - Advise to discontinue Chantix if experiencing adverse effects such as increased anxiety or suicidal ideation  PRN if CADRADS-0 3-4 month f/u with my team if CADRADS 1-2 Follow up with me if CADRADS 3+  Gloriann Larger, MD FASE [facility] Cardiologist [facility]  [address] [place], [address] [place], [place] [address] [phone]  3:34 PM

## 2024-04-14 NOTE — Patient Instructions (Addendum)
 Medication Instructions:  Take Chantix: 0.5 mg (1/2 tablet) by mouth once daily for 3 days, then increase to 0.5 mg (1/2 tablet)  twice daily for 4 days, then increase to 1 mg (whole tablet) twice daily for 11 weeks.   *If you need a refill on your cardiac medications before your next appointment, please call your pharmacy*  Lab Work: BMET today If you have labs (blood work) drawn today and your tests are completely normal, you will receive your results only by: MyChart Message (if you have MyChart) OR A paper copy in the mail If you have any lab test that is abnormal or we need to change your treatment, we will call you to review the results.  Testing/Procedures: Your physician has requested that you have cardiac CT. Cardiac computed tomography (CT) is a painless test that uses an x-ray machine to take clear, detailed pictures of your heart. For further information please visit [email or web address] Please follow instruction sheet as given.    Follow-Up:As needed At [facility], you and your health needs are our priority.  As part of our continuing mission to provide you with exceptional heart care, our providers are all part of one team.  This team includes your primary Cardiologist (physician) and Advanced Practice Providers or APPs (Physician Assistants and Nurse Practitioners) who all work together to provide you with the care you need, when you need it.   Provider:  Gloriann Larger, MD   Other Instructions   Your cardiac CT will be scheduled at one of the below locations:   [facility] [address]  [place], [place] [address] Opening April 04, 2024  If scheduled at [facility], please arrive at the [facility] and Children's Entrance (Entrance C2) of [facility] 30 minutes prior to test start time. You can use the FREE valet parking offered at entrance C (encouraged to control the heart rate for the test)  Proceed to the  [facility] Radiology Department (first floor) to check-in and test prep.   All radiology patients and guests should use entrance C2 at [facility], accessed from [facility], even though the hospital's physical address listed is [address]    If scheduled at the Heart and Vascular Tower at [address], please enter the parking lot using the [address] entrance and use the FREE valet service at the patient drop-off area. Enter the buidling and check-in with registration on the main floor.  Please follow these instructions carefully (unless otherwise directed):  An IV will be required for this test and Nitroglycerin will be given.  Hold all erectile dysfunction medications at least 3 days (72 hrs) prior to test. (Ie viagra, cialis, sildenafil, tadalafil, etc)   On the Night Before the Test: Be sure to Drink plenty of water. Do not consume any caffeinated/decaffeinated beverages or chocolate 12 hours prior to your test. Do not take any antihistamines 12 hours prior to your test.  On the Day of the Test: Drink plenty of water until 1 hour prior to the test. Do not eat any food 1 hour prior to test. You may take your regular medications prior to the test.  Take metoprolol (Lopressor) 100 mg two hours prior to test. If you take Furosemide/Hydrochlorothiazide/Spironolactone/Chlorthalidone, please HOLD on the morning of the test. Patients who wear a continuous glucose monitor MUST remove the device prior to scanning. FEMALES- please wear underwire-free bra if available, avoid dresses & tight clothing  After the Test: Drink plenty of water. After receiving IV contrast, you may experience a mild flushed feeling. This is normal. On occasion, you may experience a mild rash up to 24 hours after the test. This is not dangerous. If this occurs, you can take Benadryl 25 mg, Zyrtec, Claritin, or Allegra and increase your fluid intake. (Patients taking  Tikosyn should avoid Benadryl, and may take Zyrtec, Claritin, or Allegra) If you experience trouble breathing, this can be serious. If it is severe call 911 IMMEDIATELY. If it is mild, please call our office.  We will call to schedule your test 2-4 weeks out understanding that some insurance companies will need an authorization prior to the service being performed.   For more information and frequently asked questions, please visit our website : [email or web address]  For non-scheduling related questions, please contact the cardiac imaging nurse navigator should you have any questions/concerns: Cardiac Imaging Nurse Navigators Direct Office Dial: [phone]   For scheduling needs, including cancellations and rescheduling, please call [place], [phone].

## 2024-04-15 ENCOUNTER — Other Ambulatory Visit: Payer: Self-pay | Admitting: Family Medicine

## 2024-04-15 NOTE — Telephone Encounter (Signed)
 Pt was suppose to schedule a f/u in early March and never did, please schedule f/u with PCP asap and then route back to me to refill

## 2024-04-16 ENCOUNTER — Encounter: Payer: Self-pay | Admitting: Internal Medicine

## 2024-04-16 LAB — BASIC METABOLIC PANEL WITH GFR
BUN/Creatinine Ratio: 13 (ref 9–20)
BUN: 14 mg/dL (ref 6–24)
CO2: 20 mmol/L (ref 20–29)
Calcium: 9.7 mg/dL (ref 8.7–10.2)
Chloride: 102 mmol/L (ref 96–106)
Creatinine, Ser: 1.05 mg/dL (ref 0.76–1.27)
Glucose: 97 mg/dL (ref 70–99)
Potassium: 4.2 mmol/L (ref 3.5–5.2)
Sodium: 140 mmol/L (ref 134–144)
eGFR: 91 mL/min/{1.73_m2} (ref >59)

## 2024-04-18 NOTE — Telephone Encounter (Signed)
 lvm for pt to call office to schedule appt.

## 2024-04-19 ENCOUNTER — Encounter: Payer: Self-pay | Admitting: Urology

## 2024-04-19 ENCOUNTER — Ambulatory Visit: Admitting: Urology

## 2024-04-19 VITALS — BP 118/82 | Ht 71.0 in | Wt 260.0 lb

## 2024-04-19 DIAGNOSIS — Z3009 Encounter for other general counseling and advice on contraception: Secondary | ICD-10-CM

## 2024-04-19 MED ORDER — DIAZEPAM 5 MG PO TABS: 5.0000 mg | ORAL_TABLET | Freq: Once | ORAL | 0 refills | Status: AC | PRN

## 2024-04-19 NOTE — Patient Instructions (Signed)

## 2024-04-19 NOTE — Progress Notes (Signed)
   04/19/24 3:19 PM   Jerry Banks 1980-12-29 [id number]  CC: Discuss vasectomy  HPI: 43 year old male interested in vasectomy.  He and his wife do not desire any further biologic pregnancies.  He denies any urinary symptoms.   PMH: Past Medical History:  Diagnosis Date   Anxiety    Medication not working   Chest pain    Chicken pox    GERD (gastroesophageal reflux disease)    High blood pressure    Sleep apnea     Surgical History: Past Surgical History:  Procedure Laterality Date   APPENDECTOMY     CHOLECYSTECTOMY  2022   CHOLECYSTECTOMY, LAPAROSCOPIC  05/2020   lumber fusion  2019   SPINE SURGERY  2019    Family History: Family History  Problem Relation Age of Onset   Hypertension Mother    Hypertension Father    Diabetes Maternal Grandmother    Cancer Maternal Grandfather     Social History:  reports that he has been smoking cigarettes. He has a 15 pack-year smoking history. He has never used smokeless tobacco. He reports current alcohol use of about 2.0 standard drinks of alcohol per week. He reports current drug use. Drug: Marijuana.  Physical Exam: BP 118/82   Ht 5\' 11"  (1.803 m)   Wt 260 lb (117.9 kg)   BMI 36.26 kg/m    Constitutional:  Alert and oriented, No acute distress. Cardiovascular: No clubbing, cyanosis, or edema. Respiratory: Normal respiratory effort, no increased work of breathing. GI: Abdomen is soft, nontender, nondistended, no abdominal masses GU: Phallus with patent meatus, testicles 20 cc and descended bilaterally without masses, vas deferens easily palpable bilaterally   Assessment & Plan:   43 year old male interested in vasectomy for permanent sterilization  We discussed the risks and benefits of vasectomy at length.  Vasectomy is intended to be a permanent form of contraception, and does not produce immediate sterility.  Following vasectomy another form of contraception is required until vas occlusion is confirmed by a  post-vasectomy semen analysis obtained 2-3 months after the procedure.  Even after vas occlusion is confirmed, vasectomy is not 100% reliable in preventing pregnancy, and the failure rate is approximately 12/1998.  Repeat vasectomy is required in less than 1% of patients.  He should refrain from ejaculation for 1 week after vasectomy.  Options for fertility after vasectomy include vasectomy reversal, and sperm retrieval with in vitro fertilization or ICSI.  These options are not always successful and may be expensive.  Finally, there are other permanent and non-permanent alternatives to vasectomy available. There is no risk of erectile dysfunction, and the volume of semen will be similar to prior, as the majority of the ejaculate is from the prostate and seminal vesicles.   The procedure takes ~20 minutes.  We recommend patients take 5-10 mg of Valium 30 minutes prior, and he will need a driver post-procedure.  Local anesthetic is injected into the scrotal skin and a small segment of the vas deferens is removed, and the ends occluded. The complication rate is approximately 1-2%, and includes bleeding, infection, and development of chronic scrotal pain.  PLAN: Schedule vasectomy Valium sent to pharmacy  Jay Meth, MD 04/19/2024  [facility] [address], [address] [place], [place] [address] [phone]

## 2024-04-20 NOTE — Telephone Encounter (Signed)
Pt is schedule for appt  ?

## 2024-04-27 ENCOUNTER — Encounter: Payer: Self-pay | Admitting: Family Medicine

## 2024-04-27 ENCOUNTER — Ambulatory Visit: Admitting: Family Medicine

## 2024-04-27 ENCOUNTER — Ambulatory Visit: Payer: Self-pay | Admitting: Family Medicine

## 2024-04-27 VITALS — BP 138/80 | HR 73 | Temp 98.4°F | Ht 71.0 in | Wt 264.1 lb

## 2024-04-27 DIAGNOSIS — Z6836 Body mass index (BMI) 36.0-36.9, adult: Secondary | ICD-10-CM

## 2024-04-27 DIAGNOSIS — F418 Other specified anxiety disorders: Secondary | ICD-10-CM | POA: Diagnosis not present

## 2024-04-27 DIAGNOSIS — Z23 Encounter for immunization: Secondary | ICD-10-CM

## 2024-04-27 DIAGNOSIS — I10 Essential (primary) hypertension: Secondary | ICD-10-CM

## 2024-04-27 DIAGNOSIS — Z131 Encounter for screening for diabetes mellitus: Secondary | ICD-10-CM

## 2024-04-27 DIAGNOSIS — F17200 Nicotine dependence, unspecified, uncomplicated: Secondary | ICD-10-CM

## 2024-04-27 DIAGNOSIS — E66812 Obesity, class 2: Secondary | ICD-10-CM

## 2024-04-27 DIAGNOSIS — R0789 Other chest pain: Secondary | ICD-10-CM

## 2024-04-27 LAB — LIPID PANEL
Cholesterol: 109 mg/dL (ref 0–200)
HDL: 33.2 mg/dL — ABNORMAL LOW (ref >39.00)
LDL Cholesterol: 41 mg/dL (ref 0–99)
NonHDL: 75.3
Total CHOL/HDL Ratio: 3
Triglycerides: 174 mg/dL — ABNORMAL HIGH (ref 0.0–149.0)
VLDL: 34.8 mg/dL (ref 0.0–40.0)

## 2024-04-27 LAB — COMPREHENSIVE METABOLIC PANEL WITH GFR
ALT: 29 U/L (ref 0–53)
AST: 22 U/L (ref 0–37)
Albumin: 4.5 g/dL (ref 3.5–5.2)
Alkaline Phosphatase: 49 U/L (ref 39–117)
BUN: 13 mg/dL (ref 6–23)
CO2: 28 meq/L (ref 19–32)
Calcium: 9.2 mg/dL (ref 8.4–10.5)
Chloride: 104 meq/L (ref 96–112)
Creatinine, Ser: 1.03 mg/dL (ref 0.40–1.50)
GFR: 90.09 mL/min (ref >60.00)
Glucose, Bld: 87 mg/dL (ref 70–99)
Potassium: 4.3 meq/L (ref 3.5–5.1)
Sodium: 139 meq/L (ref 135–145)
Total Bilirubin: 0.4 mg/dL (ref 0.2–1.2)
Total Protein: 6.7 g/dL (ref 6.0–8.3)

## 2024-04-27 LAB — HEMOGLOBIN A1C: Hgb A1c MFr Bld: 6 % (ref 4.6–6.5)

## 2024-04-27 LAB — TSH: TSH: 2.12 u[IU]/mL (ref 0.35–5.50)

## 2024-04-27 NOTE — Assessment & Plan Note (Addendum)
 bp in fair control at this time  BP Readings from Last 1 Encounters:  04/27/24 138/80   No changes needed Most recent labs reviewed  Disc lifstyle change with low sodium diet and exercise  Despite missing med 2 days/ still acceptable (much better when on medication)  Last blood pressure at urology office 118/82 Tolerates medication well  Losartan  hct 100-25 mg daily  Amlodipine  5 mg daily   Trying to quit smoking   Has cardiac ca scan upcoming  Lab today

## 2024-04-27 NOTE — Patient Instructions (Addendum)
 Take care of yourself   Labs today   Continue current medicines   Get the CT scan when you can    Try the chantix   If it affects mood -hold it and let us  know    Tetanus shot today

## 2024-04-27 NOTE — Assessment & Plan Note (Signed)
 Was prescribed chantix  from cardiology  Going to start it today  Aware of potential side effects and to watch mood

## 2024-04-27 NOTE — Assessment & Plan Note (Addendum)
 No longer reports this  Reviewed cardiology note  Has cardiac CT upcoming  Trying to quit smoking Blood pressure is better   Lab today

## 2024-04-27 NOTE — Assessment & Plan Note (Signed)
 In pt with bmi of 36.8  A1c ordered

## 2024-04-27 NOTE — Progress Notes (Signed)
 Subjective:    Patient ID: Jerry Banks, male    DOB: 05/13/1981, 43 y.o.   MRN: [id number]  HPI  Wt Readings from Last 3 Encounters:  04/27/24 264 lb 2 oz (119.8 kg)  04/19/24 260 lb (117.9 kg)  04/14/24 260 lb (117.9 kg)   36.84 kg/m  Vitals:   04/27/24 0753 04/27/24 0816  BP: (!) 144/78 138/80  Pulse: 73   Temp: 98.4 F (36.9 C)   SpO2: 97%    Pt presents for follow up of HTN Also smoking and mood   Wife had mastectomy  Has had several hospitalizations  In midst of reconstruction / had issues with expander   No cp or palpitations or headaches or edema  No side effects to medicines  BP Readings from Last 3 Encounters:  04/27/24 138/80  04/19/24 118/82  04/14/24 123/84    Losartan  hct 100-25 mg daily  Last visit added amlodipine  5 mg daily   Missed last 2 d because wife was in the hospital   Has been very well controlled - at urology 118/82  No problems   Eats healthy for the most part  Tries to avoid processed foods and simple carbs    Lab Results  Component Value Date   NA 140 04/14/2024   K 4.2 04/14/2024   CO2 20 04/14/2024   GLUCOSE 97 04/14/2024   BUN 14 04/14/2024   CREATININE 1.05 04/14/2024   CALCIUM 9.7 04/14/2024   GFR 79.03 08/22/2020   EGFR 91 04/14/2024   GFRNONAA >60 01/14/2024   Lab Results  Component Value Date   WBC 9.8 01/14/2024   HGB 16.7 01/14/2024   HCT 48.4 01/14/2024   MCV 88.5 01/14/2024   PLT 297 01/14/2024      Saw cardiology on 5/8 for cp  CT scan was ordered     Smoking status 27 pack years Cardiology prescribed chantix   Plans to start it today   Depression with anxiety  Wellbutrin   Buspar  added last visit for anxiousness  This did help/ is happy with that    DM Lab Results  Component Value Date   HGBA1C 5.8 08/23/2020        Patient Active Problem List   Diagnosis Date Noted   Smoker 01/28/2024   Hypertension 01/28/2024   Chronic back pain 01/28/2024   Depression with anxiety  01/28/2024   Chest discomfort 01/28/2024   Obesity due to excess calories 01/28/2024   Diabetes mellitus screening 01/28/2024   Past Medical History:  Diagnosis Date   Anxiety    Medication not working   Chest pain    Chicken pox    GERD (gastroesophageal reflux disease)    High blood pressure    Sleep apnea    Past Surgical History:  Procedure Laterality Date   APPENDECTOMY     CHOLECYSTECTOMY  2022   CHOLECYSTECTOMY, LAPAROSCOPIC  05/2020   lumber fusion  2019   SPINE SURGERY  2019   Social History   Tobacco Use   Smoking status: Every Day    Current packs/day: 1.00    Average packs/day: 1 pack/day for 15.0 years (15.0 ttl pk-yrs)    Types: Cigarettes   Smokeless tobacco: Never  Vaping Use   Vaping status: Never Used  Substance Use Topics   Alcohol use: Yes    Alcohol/week: 2.0 standard drinks of alcohol    Types: 1 Glasses of wine, 1 Cans of beer per week    Comment: socially   Drug  use: Yes    Types: Marijuana   Family History  Problem Relation Age of Onset   Hypertension Mother    Hypertension Father    Diabetes Maternal Grandmother    Cancer Maternal Grandfather    No Known Allergies Current Outpatient Medications on File Prior to Visit  Medication Sig Dispense Refill   amLODipine  (NORVASC ) 5 MG tablet Take 1 tablet (5 mg total) by mouth daily. 90 tablet 1   buPROPion (WELLBUTRIN XL) 150 MG 24 hr tablet Take 1 tablet (150 mg total) by mouth daily. 90 tablet 1   buPROPion (WELLBUTRIN) 75 MG tablet Take 1 tablet (75 mg total) by mouth daily. 90 tablet 1   busPIRone  (BUSPAR ) 15 MG tablet TAKE 1/2 TABLET (7.5 MG TOTAL) BY MOUTH TWICE A DAY 30 tablet 0   cyclobenzaprine (FLEXERIL) 10 MG tablet Take 10 mg by mouth.     diazepam  (VALIUM ) 5 MG tablet Take 1 tablet (5 mg total) by mouth once as needed for up to 1 dose for anxiety (take 1 hour prior to vasectomy). 1 tablet 0   losartan -hydrochlorothiazide (HYZAAR) 100-25 MG tablet Take 1 tablet by mouth daily. 90  tablet 3   metoprolol  tartrate (LOPRESSOR ) 100 MG tablet Take 1 tablet (100 mg total) by mouth once for 1 dose. Take 90-120 minutes prior to scan. Hold for SBP less than 110. 1 tablet 0   naproxen sodium (ANAPROX) 550 MG tablet      varenicline  (CHANTIX  CONTINUING MONTH PAK) 1 MG tablet Take 0.5 tablets (0.5 mg total) by mouth daily for 3 days, THEN 0.5 tablets (0.5 mg total) 2 (two) times daily for 4 days, THEN 1 tablet (1 mg total) 2 (two) times daily. 160 tablet 0   No current facility-administered medications on file prior to visit.    Review of Systems  Constitutional:  Negative for activity change, appetite change, fatigue, fever and unexpected weight change.  HENT:  Negative for congestion, rhinorrhea, sore throat and trouble swallowing.   Eyes:  Negative for pain, redness, itching and visual disturbance.  Respiratory:  Positive for cough. Negative for chest tightness, shortness of breath and wheezing.        Had a cold 2 wk ago  Still coughing a bit   Cardiovascular:  Negative for chest pain and palpitations.  Gastrointestinal:  Negative for abdominal pain, blood in stool, constipation, diarrhea and nausea.  Endocrine: Negative for cold intolerance, heat intolerance, polydipsia and polyuria.  Genitourinary:  Negative for difficulty urinating, dysuria, frequency and urgency.  Musculoskeletal:  Negative for arthralgias, joint swelling and myalgias.  Skin:  Negative for pallor and rash.  Neurological:  Negative for dizziness, tremors, weakness, numbness and headaches.  Hematological:  Negative for adenopathy. Does not bruise/bleed easily.  Psychiatric/Behavioral:  Negative for decreased concentration and dysphoric mood. The patient is not nervous/anxious.        Objective:   Physical Exam Constitutional:      General: He is not in acute distress.    Appearance: Normal appearance. He is well-developed. He is obese. He is not ill-appearing or diaphoretic.  HENT:     Head:  Normocephalic and atraumatic.  Eyes:     Conjunctiva/sclera: Conjunctivae normal.     Pupils: Pupils are equal, round, and reactive to light.  Neck:     Thyroid: No thyromegaly.     Vascular: No carotid bruit or JVD.  Cardiovascular:     Rate and Rhythm: Normal rate and regular rhythm.     Heart  sounds: Normal heart sounds.     No gallop.  Pulmonary:     Effort: Pulmonary effort is normal. No respiratory distress.     Breath sounds: No stridor. No wheezing, rhonchi or rales.     Comments: Diffusely distant bs  Abdominal:     General: There is no distension or abdominal bruit.     Palpations: Abdomen is soft.  Musculoskeletal:     Cervical back: Normal range of motion and neck supple.     Right lower leg: No edema.     Left lower leg: No edema.  Lymphadenopathy:     Cervical: No cervical adenopathy.  Skin:    General: Skin is warm and dry.     Coloration: Skin is not pale.     Findings: No rash.  Neurological:     Mental Status: He is alert.     Coordination: Coordination normal.     Deep Tendon Reflexes: Reflexes are normal and symmetric. Reflexes normal.  Psychiatric:        Mood and Affect: Mood normal.     Comments: Mood is good           Assessment & Plan:   Problem List Items Addressed This Visit       Cardiovascular and Mediastinum   Hypertension - Primary   bp in fair control at this time  BP Readings from Last 1 Encounters:  04/27/24 138/80   No changes needed Most recent labs reviewed  Disc lifstyle change with low sodium diet and exercise  Despite missing med 2 days/ still acceptable (much better when on medication)  Last blood pressure at urology office 118/82 Tolerates medication well  Losartan  hct 100-25 mg daily  Amlodipine  5 mg daily   Trying to quit smoking   Has cardiac ca scan upcoming  Lab today      Relevant Orders   Comprehensive metabolic panel with GFR   TSH   Lipid panel     Other   Smoker   Was prescribed chantix   from cardiology  Going to start it today  Aware of potential side effects and to watch mood      Obesity due to excess calories   Discussed how this problem influences overall health and the risks it imposes  Reviewed plan for weight loss with lower calorie diet (via better food choices (lower glycemic and portion control) along with exercise building up to or more than 30 minutes 5 days per week including some aerobic activity and strength training         Diabetes mellitus screening   In pt with bmi of 36.8  A1c ordered       Relevant Orders   Hemoglobin A1c   Depression with anxiety   Doing better  Continues wellbutrin xl 150 mg daily and 75 mg in am with that  Buspar  7.5 mg bid   Stress level is decreasing   Encouraged good self care       Chest discomfort   No longer reports this  Reviewed cardiology note  Has cardiac CT upcoming  Trying to quit smoking Blood pressure is better   Lab today

## 2024-04-27 NOTE — Assessment & Plan Note (Signed)
 Discussed how this problem influences overall health and the risks it imposes  Reviewed plan for weight loss with lower calorie diet (via better food choices (lower glycemic and portion control) along with exercise building up to or more than 30 minutes 5 days per week including some aerobic activity and strength training

## 2024-04-27 NOTE — Assessment & Plan Note (Signed)
 Doing better  Continues wellbutrin xl 150 mg daily and 75 mg in am with that  Buspar  7.5 mg bid   Stress level is decreasing   Encouraged good self care

## 2024-06-09 ENCOUNTER — Other Ambulatory Visit: Payer: Self-pay | Admitting: Family Medicine

## 2024-06-23 ENCOUNTER — Encounter: Payer: Self-pay | Admitting: Urology

## 2024-06-23 ENCOUNTER — Encounter (HOSPITAL_COMMUNITY): Payer: Self-pay

## 2024-06-23 ENCOUNTER — Ambulatory Visit: Admitting: Urology

## 2024-06-23 VITALS — BP 105/72 | HR 95 | Ht 71.0 in | Wt 264.0 lb

## 2024-06-23 DIAGNOSIS — Z302 Encounter for sterilization: Secondary | ICD-10-CM | POA: Diagnosis not present

## 2024-06-23 NOTE — Patient Instructions (Signed)

## 2024-06-23 NOTE — Progress Notes (Signed)
 VASECTOMY PROCEDURE NOTE:  The patient was taken to the minor procedure room and placed in the supine position. His genitals were prepped and draped in the usual sterile fashion. The right vas deferens was brought up to the skin of the right upper scrotum. The skin overlying it was anesthetized with 1% lidocaine without epinephrine, anesthetic was also injected alongside the vas deferens in the direction of the inguinal canal. The no scalpel vasectomy instrument was used to make a small perforation in the scrotal skin. The vasectomy clamp was used to grasp the vas deferens. It was carefully dissected free from surrounding structures. A 1cm segment of the vas was removed, and the cut ends of the mucosa were cauterized. No significant bleeding was noted. The vas deferens was returned to the scrotum. The skin incision was closed with a simple interrupted stitch of 4-0 chromic.  Attention was then turned to the left side. The left vasectomy was performed in the same exact fashion. Sterile dressings were placed over each incision. The patient tolerated the procedure well.  IMPRESSION/DIAGNOSIS: The patient is a 43 year old gentleman who underwent a vasectomy today. Post-procedure instructions were reviewed. I stressed the importance of continuing to use birth control until he provides a semen specimen more than 2 months from now that demonstrates azoospermia.  We discussed return precautions including fever over 101, significant bleeding or hematoma, or uncontrolled pain. I also stressed the importance of avoiding strenuous activity for one week, no sexual activity or ejaculations for 5 days, intermittent icing over the next 48 hours, and scrotal support.   PLAN: The patient will be advised of his semen analysis results when available.  Redell Burnet, MD 06/23/2024

## 2024-07-21 ENCOUNTER — Encounter (HOSPITAL_COMMUNITY): Payer: Self-pay

## 2024-08-06 ENCOUNTER — Other Ambulatory Visit: Payer: Self-pay | Admitting: Internal Medicine

## 2024-08-08 ENCOUNTER — Other Ambulatory Visit: Payer: Self-pay | Admitting: Family Medicine

## 2024-08-08 DIAGNOSIS — F17200 Nicotine dependence, unspecified, uncomplicated: Secondary | ICD-10-CM

## 2024-08-09 NOTE — Telephone Encounter (Signed)
 Pt's pharmacy is requesting a refill on non cardiac medication Varenicline  (chantix ) 1 mg tablet. Would Dr. Santo like to refill this non cardiac medication? Please address

## 2024-09-26 ENCOUNTER — Other Ambulatory Visit

## 2024-10-04 ENCOUNTER — Encounter: Payer: Self-pay | Admitting: Urology

## 2024-11-29 ENCOUNTER — Other Ambulatory Visit: Payer: Self-pay | Admitting: Medical Genetics

## 2024-12-02 ENCOUNTER — Ambulatory Visit: Admitting: Family Medicine

## 2024-12-09 ENCOUNTER — Ambulatory Visit: Admitting: Family Medicine

## 2024-12-20 ENCOUNTER — Ambulatory Visit: Admitting: Family Medicine

## 2024-12-27 ENCOUNTER — Other Ambulatory Visit: Payer: Self-pay | Admitting: Medical Genetics

## 2024-12-27 DIAGNOSIS — Z006 Encounter for examination for normal comparison and control in clinical research program: Secondary | ICD-10-CM

## 2025-01-06 ENCOUNTER — Ambulatory Visit: Payer: Self-pay | Admitting: Family Medicine
